# Patient Record
Sex: Male | Born: 1955
Health system: Southern US, Community
[De-identification: ages and names within clinical notes are randomized; demographics above are authoritative.]

## PROBLEM LIST (undated history)

## (undated) DIAGNOSIS — I77810 Thoracic aortic ectasia: Secondary | ICD-10-CM

## (undated) DIAGNOSIS — I4891 Unspecified atrial fibrillation: Secondary | ICD-10-CM

## (undated) DIAGNOSIS — I447 Left bundle-branch block, unspecified: Secondary | ICD-10-CM

## (undated) DIAGNOSIS — G4733 Obstructive sleep apnea (adult) (pediatric): Secondary | ICD-10-CM

## (undated) DIAGNOSIS — K589 Irritable bowel syndrome without diarrhea: Secondary | ICD-10-CM

## (undated) DIAGNOSIS — I2729 Other secondary pulmonary hypertension: Principal | ICD-10-CM

## (undated) DIAGNOSIS — I10 Essential (primary) hypertension: Secondary | ICD-10-CM

## (undated) HISTORY — PX: CHOLECYSTECTOMY: SHX55

## (undated) HISTORY — DX: Unspecified atrial fibrillation: I48.91

## (undated) HISTORY — DX: Essential (primary) hypertension: I10

## (undated) HISTORY — DX: Thoracic aortic ectasia: I77.810

## (undated) HISTORY — DX: Irritable bowel syndrome, unspecified: K58.9

## (undated) HISTORY — DX: Other secondary pulmonary hypertension: I27.29

## (undated) HISTORY — DX: Left bundle-branch block, unspecified: I44.7

## (undated) HISTORY — DX: Obstructive sleep apnea (adult) (pediatric): G47.33

---

## 1999-11-13 ENCOUNTER — Emergency Department (HOSPITAL_COMMUNITY): Admission: EM | Admit: 1999-11-13 | Discharge: 1999-11-13 | Payer: Self-pay | Admitting: Emergency Medicine

## 2002-11-25 ENCOUNTER — Ambulatory Visit (HOSPITAL_COMMUNITY): Admission: RE | Admit: 2002-11-25 | Discharge: 2002-11-25 | Payer: Self-pay | Admitting: Pulmonary Disease

## 2002-11-25 ENCOUNTER — Encounter: Payer: Self-pay | Admitting: Pulmonary Disease

## 2003-02-21 ENCOUNTER — Encounter: Payer: Self-pay | Admitting: General Surgery

## 2003-02-21 ENCOUNTER — Ambulatory Visit (HOSPITAL_COMMUNITY): Admission: RE | Admit: 2003-02-21 | Discharge: 2003-02-22 | Payer: Self-pay | Admitting: General Surgery

## 2014-04-28 ENCOUNTER — Encounter (HOSPITAL_BASED_OUTPATIENT_CLINIC_OR_DEPARTMENT_OTHER): Payer: Self-pay | Admitting: *Deleted

## 2014-04-28 ENCOUNTER — Emergency Department (HOSPITAL_COMMUNITY): Payer: BC Managed Care – PPO

## 2014-04-28 ENCOUNTER — Emergency Department (HOSPITAL_BASED_OUTPATIENT_CLINIC_OR_DEPARTMENT_OTHER): Payer: BC Managed Care – PPO

## 2014-04-28 ENCOUNTER — Emergency Department (HOSPITAL_BASED_OUTPATIENT_CLINIC_OR_DEPARTMENT_OTHER)
Admission: EM | Admit: 2014-04-28 | Discharge: 2014-04-29 | Disposition: A | Discharge status: Home / Self Care | Payer: BC Managed Care – PPO | Attending: Emergency Medicine | Admitting: Emergency Medicine

## 2014-04-28 DIAGNOSIS — R4182 Altered mental status, unspecified: Secondary | ICD-10-CM | POA: Diagnosis not present

## 2014-04-28 DIAGNOSIS — H532 Diplopia: Secondary | ICD-10-CM

## 2014-04-28 DIAGNOSIS — N289 Disorder of kidney and ureter, unspecified: Secondary | ICD-10-CM

## 2014-04-28 DIAGNOSIS — I10 Essential (primary) hypertension: Secondary | ICD-10-CM | POA: Insufficient documentation

## 2014-04-28 LAB — URINALYSIS, ROUTINE W REFLEX MICROSCOPIC
BILIRUBIN URINE: NEGATIVE
Glucose, UA: NEGATIVE mg/dL
Hgb urine dipstick: NEGATIVE
Ketones, ur: NEGATIVE mg/dL
Leukocytes, UA: NEGATIVE
Nitrite: NEGATIVE
PROTEIN: NEGATIVE mg/dL
Specific Gravity, Urine: 1.014 (ref 1.005–1.030)
UROBILINOGEN UA: 0.2 mg/dL (ref 0.0–1.0)
pH: 7 (ref 5.0–8.0)

## 2014-04-28 LAB — DIFFERENTIAL
BASOS PCT: 0 % (ref 0–1)
Basophils Absolute: 0 10*3/uL (ref 0.0–0.1)
Eosinophils Absolute: 0.3 10*3/uL (ref 0.0–0.7)
Eosinophils Relative: 3 % (ref 0–5)
LYMPHS ABS: 3.5 10*3/uL (ref 0.7–4.0)
Lymphocytes Relative: 34 % (ref 12–46)
MONO ABS: 0.9 10*3/uL (ref 0.1–1.0)
MONOS PCT: 9 % (ref 3–12)
NEUTROS ABS: 5.5 10*3/uL (ref 1.7–7.7)
Neutrophils Relative %: 54 % (ref 43–77)

## 2014-04-28 LAB — COMPREHENSIVE METABOLIC PANEL
ALT: 27 U/L (ref 0–53)
AST: 24 U/L (ref 0–37)
Albumin: 4.3 g/dL (ref 3.5–5.2)
Alkaline Phosphatase: 79 U/L (ref 39–117)
Anion gap: 13 (ref 5–15)
BUN: 16 mg/dL (ref 6–23)
CHLORIDE: 99 meq/L (ref 96–112)
CO2: 27 meq/L (ref 19–32)
CREATININE: 1.4 mg/dL — AB (ref 0.50–1.35)
Calcium: 9.6 mg/dL (ref 8.4–10.5)
GFR calc Af Amer: 64 mL/min — ABNORMAL LOW (ref >90)
GFR calc non Af Amer: 55 mL/min — ABNORMAL LOW (ref >90)
GLUCOSE: 105 mg/dL — AB (ref 70–99)
Potassium: 4.1 mEq/L (ref 3.7–5.3)
Sodium: 139 mEq/L (ref 137–147)
TOTAL PROTEIN: 8.3 g/dL (ref 6.0–8.3)
Total Bilirubin: 0.6 mg/dL (ref 0.3–1.2)

## 2014-04-28 LAB — RAPID URINE DRUG SCREEN, HOSP PERFORMED
AMPHETAMINES: NOT DETECTED
BARBITURATES: NOT DETECTED
BENZODIAZEPINES: NOT DETECTED
COCAINE: NOT DETECTED
Opiates: NOT DETECTED
TETRAHYDROCANNABINOL: NOT DETECTED

## 2014-04-28 LAB — ETHANOL: Alcohol, Ethyl (B): <11 mg/dL (ref 0–11)

## 2014-04-28 LAB — PROTIME-INR
INR: 0.9 (ref 0.00–1.49)
Prothrombin Time: 12.2 seconds (ref 11.6–15.2)

## 2014-04-28 LAB — CBC
HEMATOCRIT: 40.9 % (ref 39.0–52.0)
HEMOGLOBIN: 13.7 g/dL (ref 13.0–17.0)
MCH: 29.8 pg (ref 26.0–34.0)
MCHC: 33.5 g/dL (ref 30.0–36.0)
MCV: 89.1 fL (ref 78.0–100.0)
Platelets: 278 10*3/uL (ref 150–400)
RBC: 4.59 MIL/uL (ref 4.22–5.81)
RDW: 13.1 % (ref 11.5–15.5)
WBC: 10.4 10*3/uL (ref 4.0–10.5)

## 2014-04-28 LAB — APTT: APTT: 28 s (ref 24–37)

## 2014-04-28 LAB — TROPONIN I: Troponin I: <0.3 ng/mL (ref <0.30)

## 2014-04-28 MED ORDER — LORAZEPAM 2 MG/ML IJ SOLN
0.5000 mg | Freq: Once | INTRAMUSCULAR | Status: AC
  Administered 2014-04-28: 0.5 mg via INTRAVENOUS
  Filled 2014-04-28: qty 1

## 2014-04-28 MED ORDER — LABETALOL HCL 5 MG/ML IV SOLN
20.0000 mg | Freq: Once | INTRAVENOUS | Status: AC
  Administered 2014-04-28: 20 mg via INTRAVENOUS
  Filled 2014-04-28: qty 4

## 2014-04-28 MED ORDER — LORAZEPAM 2 MG/ML IJ SOLN
1.0000 mg | Freq: Once | INTRAMUSCULAR | Status: AC
  Administered 2014-04-28: 1 mg via INTRAVENOUS
  Filled 2014-04-28: qty 1

## 2014-04-28 NOTE — ED Provider Notes (Signed)
10:09 PM Patient sent [facility] for MRI to r/o stroke.  He has a complaint of diplopia.  Per Dr Sandi Carneelo's conversation with Dr Littie DeedsGentry, neurology was consulted and MRI recommended.  If MRI negative, pt may be d/c home.   10:26 PM Patient reports he was sitting at his computer around 11:00 this morning and suddenly noticed change in his vision.  Describes vertical diplopia with both eyes, normal vision with each eye.  States it does depend on where he looks, sometimes has the diplopia, other times he does not.  Denies any other focal neurologic deficits.   Patient is A&Ox4, NAD, RRR, lungs CTAB, abd soft, NT, CN II-XII intact, EOMs intact, no pronator drift, grip strengths equal bilaterally; strength 5/5 in all extremities, sensation intact in all extremities; finger to nose normal.    MRI available and ready for patient.  Ativan given for claustrophobia.    1:12 AM MRI is negative.  Awaiting call from neurology for discussion.      1:28 AM Discussed pt with Dr Thad Rangereynolds who originally spoke with Dr Littie DeedsGentry about the patient.  MRI is negative.  Plan is for ophthalmology follow up.    Discussed result, findings, treatment, and follow up  with patient.  Pt given return precautions.  Pt verbalizes understanding and agrees with plan.       Results for orders placed or performed during the hospital encounter of 04/28/14  Ethanol  Result Value Ref Range   Alcohol, Ethyl (B) <11 0 - 11 mg/dL  Protime-INR  Result Value Ref Range   Prothrombin Time 12.2 11.6 - 15.2 seconds   INR 0.90 0.00 - 1.49  APTT  Result Value Ref Range   aPTT 28 24 - 37 seconds  CBC  Result Value Ref Range   WBC 10.4 4.0 - 10.5 K/uL   RBC 4.59 4.22 - 5.81 MIL/uL   Hemoglobin 13.7 13.0 - 17.0 g/dL   HCT 16.140.9 09.639.0 - 04.552.0 %   MCV 89.1 78.0 - 100.0 fL   MCH 29.8 26.0 - 34.0 pg   MCHC 33.5 30.0 - 36.0 g/dL   RDW 40.913.1 81.111.5 - 91.415.5 %   Platelets 278 150 - 400 K/uL  Differential  Result Value Ref Range    Neutrophils Relative % 54 43 - 77 %   Neutro Abs 5.5 1.7 - 7.7 K/uL   Lymphocytes Relative 34 12 - 46 %   Lymphs Abs 3.5 0.7 - 4.0 K/uL   Monocytes Relative 9 3 - 12 %   Monocytes Absolute 0.9 0.1 - 1.0 K/uL   Eosinophils Relative 3 0 - 5 %   Eosinophils Absolute 0.3 0.0 - 0.7 K/uL   Basophils Relative 0 0 - 1 %   Basophils Absolute 0.0 0.0 - 0.1 K/uL  Comprehensive metabolic panel  Result Value Ref Range   Sodium 139 137 - 147 mEq/L   Potassium 4.1 3.7 - 5.3 mEq/L   Chloride 99 96 - 112 mEq/L   CO2 27 19 - 32 mEq/L   Glucose, Bld 105 (H) 70 - 99 mg/dL   BUN 16 6 - 23 mg/dL   Creatinine, Ser 7.821.40 (H) 0.50 - 1.35 mg/dL   Calcium 9.6 8.4 - 95.610.5 mg/dL   Total Protein 8.3 6.0 - 8.3 g/dL   Albumin 4.3 3.5 - 5.2 g/dL   AST 24 0 - 37 U/L   ALT 27 0 - 53 U/L   Alkaline Phosphatase 79 39 - 117 U/L  Total Bilirubin 0.6 0.3 - 1.2 mg/dL   GFR calc non Af Amer 55 (L) >90 mL/min   GFR calc Af Amer 64 (L) >90 mL/min   Anion gap 13 5 - 15  Urine Drug Screen  Result Value Ref Range   Opiates NONE DETECTED NONE DETECTED   Cocaine NONE DETECTED NONE DETECTED   Benzodiazepines NONE DETECTED NONE DETECTED   Amphetamines NONE DETECTED NONE DETECTED   Tetrahydrocannabinol NONE DETECTED NONE DETECTED   Barbiturates NONE DETECTED NONE DETECTED  Urinalysis, Routine w reflex microscopic  Result Value Ref Range   Color, Urine YELLOW YELLOW   APPearance CLEAR CLEAR   Specific Gravity, Urine 1.014 1.005 - 1.030   pH 7.0 5.0 - 8.0   Glucose, UA NEGATIVE NEGATIVE mg/dL   Hgb urine dipstick NEGATIVE NEGATIVE   Bilirubin Urine NEGATIVE NEGATIVE   Ketones, ur NEGATIVE NEGATIVE mg/dL   Protein, ur NEGATIVE NEGATIVE mg/dL   Urobilinogen, UA 0.2 0.0 - 1.0 mg/dL   Nitrite NEGATIVE NEGATIVE   Leukocytes, UA NEGATIVE NEGATIVE  Troponin I  Result Value Ref Range   Troponin I <0.30 <0.30 ng/mL   Ct Head Wo Contrast  04/28/2014   CLINICAL DATA:  Diplopia.  EXAM: CT HEAD WITHOUT CONTRAST  TECHNIQUE:  Contiguous axial images were obtained from the base of the skull through the vertex without intravenous contrast.  COMPARISON:  None.  FINDINGS: The brain demonstrates no evidence of hemorrhage, infarction, edema, mass effect, extra-axial fluid collection, hydrocephalus or mass lesion. The skull is unremarkable.  IMPRESSION: Normal head CT.   Electronically Signed   By: Irish Lack M.D.   On: 04/28/2014 20:00   Mr Brain Wo Contrast  04/29/2014   CLINICAL DATA:  Initial evaluation for sudden onset diplopia.  EXAM: MRI HEAD WITHOUT CONTRAST  TECHNIQUE: Multiplanar, multiecho pulse sequences of the brain and surrounding structures were obtained without intravenous contrast.  COMPARISON:  Prior head CT performed earlier on the same day.  FINDINGS: Study is somewhat degraded by motion artifact.  Age-appropriate atrophy is present. Few scattered tiny T2/FLAIR hyperintensities noted within the periventricular and deep white matter, nonspecific. No mass lesion, midline shift, or extra-axial fluid collection. Ventricles are normal in size without evidence of hydrocephalus.  No diffusion-weighted signal abnormality is identified to suggest acute intracranial infarct. Gray-white matter differentiation is maintained. Normal flow voids are seen within the intracranial vasculature. No intracranial hemorrhage identified.  The cervicomedullary junction is normal. Pituitary gland is within normal limits. Pituitary stalk is midline. The globes and optic nerves demonstrate a normal appearance with normal signal intensity. The  The bone marrow signal intensity is normal. Calvarium is intact. Visualized upper cervical spine is within normal limits.  Scalp soft tissues are unremarkable.  Mild polypoid mucosal thickening present within the right maxillary sinus. Paranasal sinuses are otherwise clear. No mastoid effusion.  IMPRESSION: Normal MRI for patient age with no acute intracranial abnormality identified.   Electronically  Signed   By: Rise Mu M.D.   On: 04/29/2014 00:01   Dg Chest Portable 1 View  04/28/2014   CLINICAL DATA:  Diplopia.  EXAM: PORTABLE CHEST - 1 VIEW  COMPARISON:  None.  FINDINGS: The heart size is at the upper limits of normal. Pulmonary venous hypertensive changes present without evidence of overt edema. There is no evidence of airspace consolidation, pneumothorax, nodule or pleural fluid. The visualized skeletal structures are unremarkable.  IMPRESSION: Pulmonary venous hypertensive changes and top-normal heart size.   Electronically Signed   By:  Irish LackGlenn  Yamagata M.D.   On: 04/28/2014 19:56      Trixie Dredgemily Katha Kuehne, PA-C 04/29/14 0141  Geoffery Lyonsouglas Delo, MD 04/29/14 [phone]

## 2014-04-28 NOTE — ED Notes (Signed)
Patient transported to CT 

## 2014-04-28 NOTE — ED Notes (Signed)
Pt reports he was working on computer at work today and sudden onset double vision- states sx have not resolved- pt wears reading glasses- ambulatory with steady gait

## 2014-04-28 NOTE — ED Notes (Signed)
Double vision since 11am this morning. Pt has no medical history and doesn't take any meds. Pt denies headache, weakness or chest pain/SOB. Pt anxious about MRI. VSS BP 190/110

## 2014-04-28 NOTE — ED Provider Notes (Signed)
CSN: [id number]     Arrival date & time 04/28/14  1842 History   First MD Initiated Contact with Patient 04/28/14 1856     Chief Complaint  Patient presents with  . Diplopia     (Consider location/radiation/quality/duration/timing/severity/associated sxs/prior Treatment) Patient is a 58 y.o. male presenting with eye problem.  Eye Problem Location:  Both Quality: double vision. Severity:  Moderate Onset quality:  Sudden Duration:  7 hours Timing:  Constant Progression:  Unchanged Chronicity:  New Context comment:  While sitting at the computer Relieved by:  Closing eye Worsened by:  Nothing tried Ineffective treatments:  None tried Associated symptoms: double vision     History reviewed. No pertinent past medical history. Past Surgical History  Procedure Laterality Date  . Cholecystectomy     History reviewed. No pertinent family history. History  Substance Use Topics  . Smoking status: Current Every Day Smoker -- 0.50 packs/day    Types: Cigarettes  . Smokeless tobacco: Not on file  . Alcohol Use: No    Review of Systems  Eyes: Positive for double vision.  All other systems reviewed and are negative.     Allergies  Review of patient's allergies indicates no known allergies.  Home Medications   Prior to Admission medications   Medication Sig Start Date End Date Taking? Authorizing Provider  aspirin EC 81 MG tablet Take 81 mg by mouth daily.   Yes Historical Provider, MD  loperamide (IMODIUM) 2 MG capsule Take 2 mg by mouth as needed for diarrhea or loose stools.   Yes Historical Provider, MD   BP 178/97 mmHg  Pulse 76  Temp(Src) 98.5 F (36.9 C) (Oral)  Resp 18  Ht 6\' 4"  (1.93 m)  Wt 285 lb (129.275 kg)  BMI 34.71 kg/m2  SpO2 98% Physical Exam  Constitutional: He is oriented to person, place, and time. He appears well-developed and well-nourished.  HENT:  Head: Normocephalic and atraumatic.  Eyes: Conjunctivae and EOM are normal.  Neck: Normal  range of motion. Neck supple.  Cardiovascular: Normal rate, regular rhythm and normal heart sounds.   Pulmonary/Chest: Effort normal and breath sounds normal. No respiratory distress.  Abdominal: He exhibits no distension. There is no tenderness. There is no rebound and no guarding.  Musculoskeletal: Normal range of motion.  Neurological: He is alert and oriented to person, place, and time. He has normal strength and normal reflexes. No cranial nerve deficit or sensory deficit.  Skin: Skin is warm and dry.  Vitals reviewed.   ED Course  Procedures (including critical care time) Labs Review Labs Reviewed  COMPREHENSIVE METABOLIC PANEL - Abnormal; Notable for the following:    Glucose, Bld 105 (*)    Creatinine, Ser 1.40 (*)    GFR calc non Af Amer 55 (*)    GFR calc Af Amer 64 (*)    All other components within normal limits  ETHANOL  PROTIME-INR  APTT  CBC  DIFFERENTIAL  URINE RAPID DRUG SCREEN (HOSP PERFORMED)  URINALYSIS, ROUTINE W REFLEX MICROSCOPIC  TROPONIN I    Imaging Review Ct Head Wo Contrast  04/28/2014   CLINICAL DATA:  Diplopia.  EXAM: CT HEAD WITHOUT CONTRAST  TECHNIQUE: Contiguous axial images were obtained from the base of the skull through the vertex without intravenous contrast.  COMPARISON:  None.  FINDINGS: The brain demonstrates no evidence of hemorrhage, infarction, edema, mass effect, extra-axial fluid collection, hydrocephalus or mass lesion. The skull is unremarkable.  IMPRESSION: Normal head CT.   Electronically Signed  By: Irish LackGlenn  Yamagata M.D.   On: 04/28/2014 20:00   Dg Chest Portable 1 View  04/28/2014   CLINICAL DATA:  Diplopia.  EXAM: PORTABLE CHEST - 1 VIEW  COMPARISON:  None.  FINDINGS: The heart size is at the upper limits of normal. Pulmonary venous hypertensive changes present without evidence of overt edema. There is no evidence of airspace consolidation, pneumothorax, nodule or pleural fluid. The visualized skeletal structures are  unremarkable.  IMPRESSION: Pulmonary venous hypertensive changes and top-normal heart size.   Electronically Signed   By: Irish LackGlenn  Yamagata M.D.   On: 04/28/2014 19:56     EKG Interpretation   Date/Time:  Monday April 28 2014 19:03:16 EST Ventricular Rate:  90 PR Interval:  152 QRS Duration: 84 QT Interval:  392 QTC Calculation: 479 R Axis:   54 Text Interpretation:  Normal sinus rhythm Normal ECG No significant change  since last tracing Confirmed by Mirian MoGentry, Kalonji Zurawski [phone]([id number]) on 04/28/2014  8:09:02 PM      MDM   Final diagnoses:  Altered mental status  Diplopia    58 y.o. male without pertinent PMH presents with onset of double vision without other symptoms.  No other symptoms. No focal neurodeficits. Patient hypertensive to 230/130. Rest of exam unremarkable. Patient denies headache, GI symptoms, rest or symptoms. He denies any pain.  Labetalol 20mg  given with resolution of hypertension but symptoms persisted. I spoke with neurology who suggested MRI. [facility]. Patient transferred in stable condition.  1. Diplopia   2. Altered mental status         Mirian MoMatthew Axcel Horsch, MD 04/28/14 [phone]

## 2014-04-29 NOTE — Discharge Instructions (Signed)
Read the information below.  You may return to the Emergency Department at any time for worsening condition or any new symptoms that concern you.   If you develop weakness or numbness in your arms or legs, difficulty speaking or walking, pain in your eye, change in your vision, swelling around your eye, difficulty moving your eye, or fevers greater than 100.4, return to the Emergency Department immediately for a recheck.   Please have your blood pressure and your kidney function rechecked within one week.   Diplopia  Double vision (diplopia) means that you are seeing two of everything. Diplopia usually occurs with both eyes open, and may be worse when looking in one particular direction. If both eyes must be open to see double, this is called binocular diplopia. If double images are seen in just one eye, this is called monocular diplopia.  CAUSES  Binocular Diplopia  Disorder affecting the muscles that move the eyes or the nerves that control those muscles.  Tumor or other mass pushing on an eye from beside or behind the eye.  Myasthenia gravis. This is a neuromuscular illness that causes the body's muscles to tire easily. The eye muscles and eyelid muscles become weak. The eyes do not track together well.  Grave's disease. This is an overactivity of the thyroid gland. This condition causes swelling of tissues around the eyes. This produces a bulging out of the eyeball.  Blowout fracture of the bone around the eye. The muscles of the eye socket are damaged. This often happens when the eye is hit with force.  Complications after certain surgeries, such as a lens implant after cataract surgery.  Fluid-filled mass (abscess) behind or beside the eye, infection, or abnormal connection between arteries and veins. Sometimes, no cause is found. Monocular Diplopia  Problems with corrective lens or contacts.  Corneal abrasion, infection, severe injury, or bulging and irregularity of the corneal  surface (keratoconus).  Irregularities of the pupil from drugs, severe injury, or other causes.  Problems involving the lens of the eye, such as opacities or cataracts.  Complications after certain surgeries, such as a lens implant after cataract surgery.  Retinal detachment or problems involving the blood vessels of the retina. Sometimes, no cause is found. SYMPTOMS  Binocular Diplopia  When one eye is closed or covered, the double images disappear. Monocular Diplopia  When the unaffected eye is closed or covered, the double images remain. The double images disappear when the affected eye is closed or covered. DIAGNOSIS  A diagnosis is made during an eye exam. TREATMENT  Treatment depends on the cause or underlying disease.   Relief of double vision symptoms may be achieved by patching one eye or by using special glasses.  Surgery on the muscles of the eye may be needed. SEEK IMMEDIATE MEDICAL CARE IF:   You see two images of a single object you are looking at, either with both eyes open or with just one eye open. Document Released: 03/17/2004 Document Revised: 08/08/2011 Document Reviewed: 01/02/2008 [facility] Patient Information 2015 [employer] [place]. This information is not intended to replace advice given to you by your health care provider. Make sure you discuss any questions you have with your health care provider.

## 2014-05-16 ENCOUNTER — Encounter: Payer: Self-pay | Admitting: *Deleted

## 2014-05-20 ENCOUNTER — Encounter: Payer: Self-pay | Admitting: Cardiology

## 2014-05-20 ENCOUNTER — Ambulatory Visit (INDEPENDENT_AMBULATORY_CARE_PROVIDER_SITE_OTHER): Payer: BC Managed Care – PPO | Admitting: Cardiology

## 2014-05-20 VITALS — BP 130/88 | HR 85 | Ht 76.0 in | Wt 269.8 lb

## 2014-05-20 DIAGNOSIS — I2729 Other secondary pulmonary hypertension: Secondary | ICD-10-CM

## 2014-05-20 DIAGNOSIS — I10 Essential (primary) hypertension: Secondary | ICD-10-CM

## 2014-05-20 DIAGNOSIS — I270 Primary pulmonary hypertension: Secondary | ICD-10-CM

## 2014-05-20 HISTORY — DX: Other secondary pulmonary hypertension: I27.29

## 2014-05-20 NOTE — Progress Notes (Signed)
  [address] [place], [place]  [address] Phone: [phone] Fax:  [phone]  Date:  05/20/2014   ID:  Jesse Rice, DOB [birth year], MRN [id number]  PCP:  Marcelo BaldyMAUNEY, JESSICA S, PA-C  Cardiologist:  Armanda Magicraci Mahima Hottle MD    History of Present Illness: Jesse Rice is a 58 y.o. male with no prior cardiac history who is referred by his PCP for evaluation of possible pulmonary HTN.  [facility] acute diplopia in the setting of hypertensive crisis and workup was negative.  A chest xray was done which noted pulmonary venous HTN but no CHF and he is now referred here for further evaluation.  He denies any chest pain or pressure, SOB, LE edema, palpitations, dizziness or syncope.  He rarely will have some DOE when running up a lot of stairs.  He states that he has been a "salt freak" in the past and is now following a low sodium diet.     Wt Readings from Last 3 Encounters:  05/20/14 269 lb 12.8 oz (122.38 kg)  04/28/14 285 lb (129.275 kg)     Past Medical History  Diagnosis Date  . IBS (irritable bowel syndrome)   . Hypertensive pulmonary venous disease 05/20/2014  . Hypertension     Current Outpatient Prescriptions  Medication Sig Dispense Refill  . aspirin EC 81 MG tablet Take 81 mg by mouth daily.    Marland Kitchen. lisinopril-hydrochlorothiazide (PRINZIDE,ZESTORETIC) 20-25 MG per tablet Take 1 tablet by mouth daily.     Marland Kitchen. loperamide (IMODIUM) 2 MG capsule Take 2 mg by mouth as needed for diarrhea or loose stools.     No current facility-administered medications for this visit.    Allergies:   No Known Allergies  Social History:  The patient  reports that he has quit smoking. His smoking use included Cigarettes. He smoked 0.50 packs per day. He does not have any smokeless tobacco history on file. He reports that he does not drink alcohol or use illicit drugs.   Family History:  The patient's family history includes Arrhythmia in his mother; COPD in his  father; Diabetes in his father; Heart murmur in his father; Hypertension in his father and mother; Rheumatic fever in his father.   ROS:  Please see the history of present illness.      All other systems reviewed and negative.   PHYSICAL EXAM: VS:  BP 130/88 mmHg  Pulse 85  Ht 6\' 4"  (1.93 m)  Wt 269 lb 12.8 oz (122.38 kg)  BMI 32.85 kg/m2 Well nourished, well developed, in no acute distress HEENT: normal Neck: no JVD Cardiac:  normal S1, S2; RRR; no murmur Lungs:  clear to auscultation bilaterally, no wheezing, rhonchi or rales Abd: soft, nontender, no hepatomegaly Ext: no edema Skin: warm and dry Neuro:  CNs 2-12 intact, no focal abnormalities noted  EKG:     NSR with no ST changes  ASSESSMENT AND PLAN:  1. HTN with good control.  He is following a low sodium diet.  Continue Lisinopril HCT 2. Pulmonary venous HTN by chest xray most likely related to hypertensive urgency.  I will get a 2D echo to assess for pulmonary HTN and also LVF.     Signed, Armanda Magicraci Colbin Jovel, MD [facility] 05/20/2014 11:20 AM

## 2014-05-20 NOTE — Patient Instructions (Addendum)
Your physician has requested that you have an echocardiogram. Echocardiography is a painless test that uses sound waves to create images of your heart. It provides your doctor with information about the size and shape of your heart and how well your heart's chambers and valves are working. This procedure takes approximately one hour. There are no restrictions for this procedure.  Contact Dr. Mayford Knifeurner for follow up as needed.

## 2014-05-27 ENCOUNTER — Ambulatory Visit (HOSPITAL_COMMUNITY): Payer: BC Managed Care – PPO | Attending: Cardiology | Admitting: Cardiology

## 2014-05-27 DIAGNOSIS — I10 Essential (primary) hypertension: Secondary | ICD-10-CM | POA: Diagnosis not present

## 2014-05-27 DIAGNOSIS — E669 Obesity, unspecified: Secondary | ICD-10-CM | POA: Insufficient documentation

## 2014-05-27 DIAGNOSIS — Z87891 Personal history of nicotine dependence: Secondary | ICD-10-CM | POA: Diagnosis not present

## 2014-05-27 DIAGNOSIS — I2729 Other secondary pulmonary hypertension: Secondary | ICD-10-CM

## 2014-05-27 DIAGNOSIS — I270 Primary pulmonary hypertension: Secondary | ICD-10-CM | POA: Diagnosis present

## 2014-05-27 NOTE — Progress Notes (Signed)
Echo performed. 

## 2016-12-08 ENCOUNTER — Ambulatory Visit (INDEPENDENT_AMBULATORY_CARE_PROVIDER_SITE_OTHER): Payer: BLUE CROSS/BLUE SHIELD | Admitting: Orthopedic Surgery

## 2016-12-08 ENCOUNTER — Encounter (INDEPENDENT_AMBULATORY_CARE_PROVIDER_SITE_OTHER): Payer: Self-pay | Admitting: Orthopedic Surgery

## 2016-12-08 VITALS — Ht 76.0 in | Wt 269.0 lb

## 2016-12-08 DIAGNOSIS — M25561 Pain in right knee: Secondary | ICD-10-CM | POA: Diagnosis not present

## 2016-12-08 MED ORDER — METHYLPREDNISOLONE ACETATE 40 MG/ML IJ SUSP
40.0000 mg | INTRAMUSCULAR | Status: AC | PRN
  Administered 2016-12-08: 40 mg via INTRA_ARTICULAR

## 2016-12-08 MED ORDER — LIDOCAINE HCL 1 % IJ SOLN
5.0000 mL | INTRAMUSCULAR | Status: AC | PRN
  Administered 2016-12-08: 5 mL

## 2016-12-08 NOTE — Progress Notes (Signed)
Office Visit Note   Patient: Jesse Rice           Date of Birth: 04/25/1956           MRN: [id number] Visit Date: 12/08/2016              Requested by: Marcelo Baldy, PA-C [address] [place], [place] [address] PCP: Marcelo Baldy, PA-C  Chief Complaint  Patient presents with  . Right Knee - Pain    Work related injury climbing into trailer.       HPI: Patient states that on 11/21/2016 he was getting up on a trailer when he twisted his right knee with external rotation causing acute pain. Patient complains of pain both medially and laterally posteriorly as well as in the patellofemoral joint. He states the knee feels like it'll occasionally lock up on him and states that he does have decreased range of motion due to swelling. He has been using ice and elevation. He has been using ibuprofen. Radiographs were obtained which showed no evidence of a fracture.  Assessment & Plan: Visit Diagnoses:  1. Acute pain of right knee     Plan: Patient was given a note to postpone jury duty for 2 months. He was given a note for light duty seated work for the next 2 weeks. Recommended continued ice and elevation and weightbearing as tolerated with his crutches. Right knee was injected without complications from the anteromedial portal.  Follow-Up Instructions: Return in about 2 weeks (around 12/22/2016).   Ortho Exam  Patient is alert, oriented, no adenopathy, well-dressed, normal affect, normal respiratory effort. Patient has an antalgic gait. He has a mild effusion collaterals and cruciates are stable the MCL is nontender to palpation medial and lateral joint lines are minimally tender to palpation there is no tenderness in the patellofemoral joint with palpation.  Imaging: No results found.  Labs: No results found for: HGBA1C, ESRSEDRATE, CRP, LABURIC, REPTSTATUS, GRAMSTAIN, CULT, LABORGA  Orders:  No orders of the defined types were placed in this encounter.  No  orders of the defined types were placed in this encounter.    Procedures: Large Joint Inj Date/Time: 12/08/2016 9:17 AM Performed by: DUDA, MARCUS V Authorized by: Nadara Mustard   Consent Given by:  Patient Site marked: the procedure site was marked   Timeout: prior to procedure the correct patient, procedure, and site was verified   Indications:  Pain and diagnostic evaluation Location:  Knee Site:  R knee Prep: patient was prepped and draped in usual sterile fashion   Needle Size:  22 G Needle Length:  1.5 inches Approach:  Anteromedial Ultrasound Guidance: No   Fluoroscopic Guidance: No   Arthrogram: No   Medications:  5 mL lidocaine 1 %; 40 mg methylPREDNISolone acetate 40 MG/ML Aspiration Attempted: No   Patient tolerance:  Patient tolerated the procedure well with no immediate complications    Clinical Data: No additional findings.  ROS:  All other systems negative, except as noted in the HPI. Review of Systems  Objective: Vital Signs: Ht 6\' 4"  (1.93 m)   Wt 269 lb (122 kg)   BMI 32.74 kg/m   Specialty Comments:  No specialty comments available.  PMFS History: Patient Active Problem List   Diagnosis Date Noted  . Hypertensive pulmonary venous disease (HCC) 05/20/2014  . Benign essential HTN 05/20/2014   Past Medical History:  Diagnosis Date  . Hypertension   . Hypertensive pulmonary venous disease (HCC) 05/20/2014  .  IBS (irritable bowel syndrome)     Family History  Problem Relation Age of Onset  . Hypertension Mother   . Arrhythmia Mother   . Rheumatic fever Father   . Heart murmur Father   . Hypertension Father   . COPD Father   . Diabetes Father     Past Surgical History:  Procedure Laterality Date  . CHOLECYSTECTOMY     Social History   Occupational History  . Not on file.   Social History Main Topics  . Smoking status: Former Smoker    Packs/day: 0.50    Types: Cigarettes  . Smokeless tobacco: Never Used  . Alcohol use No    . Drug use: No  . Sexual activity: Not on file

## 2016-12-22 ENCOUNTER — Encounter (INDEPENDENT_AMBULATORY_CARE_PROVIDER_SITE_OTHER): Payer: Self-pay | Admitting: Orthopedic Surgery

## 2016-12-22 ENCOUNTER — Ambulatory Visit (INDEPENDENT_AMBULATORY_CARE_PROVIDER_SITE_OTHER): Payer: BLUE CROSS/BLUE SHIELD | Admitting: Orthopedic Surgery

## 2016-12-22 DIAGNOSIS — M25561 Pain in right knee: Secondary | ICD-10-CM

## 2016-12-22 NOTE — Progress Notes (Signed)
   Office Visit Note   Patient: Jesse Rice           Date of Birth: [birth year]           MRN: [id number] Visit Date: 12/22/2016              Requested by: Marcelo BaldyMauney, Jessica S, PA-C [address] [place], [place] [address] PCP: Marcelo BaldyMauney, Jessica S, PA-C  Chief Complaint  Patient presents with  . Right Knee - Follow-up      HPI: Patient states he still has knee pain with hyperflexion and with kneeling as well as from getting up from a low position. He states that the injection provided him good relief he is been on light duty work. Patient complains of pain primarily in the patellofemoral joint.  Assessment & Plan: Visit Diagnoses:  1. Acute pain of right knee     Plan: We will allow patient return to work without restrictions discussed that we can proceed with approximately 3 injections a year if necessary. Patient was given instructions for quad isometrics straight leg raises to improve the VMO strength.  Follow-Up Instructions: Return if symptoms worsen or fail to improve.   Ortho Exam  Patient is alert, oriented, no adenopathy, well-dressed, normal affect, normal respiratory effort. Examination patient has no effusion is full range of motion of his knee he has minimal tenderness to palpation in the patellofemoral joint there is no tenderness to palpation medial lateral joint line collaterals and cruciates are stable. There is no redness no cellulitis.  Imaging: No results found.  Labs: No results found for: HGBA1C, ESRSEDRATE, CRP, LABURIC, REPTSTATUS, GRAMSTAIN, CULT, LABORGA  Orders:  No orders of the defined types were placed in this encounter.  No orders of the defined types were placed in this encounter.    Procedures: No procedures performed  Clinical Data: No additional findings.  ROS:  All other systems negative, except as noted in the HPI. Review of Systems  Objective: Vital Signs: There were no vitals taken for this visit.  Specialty  Comments:  No specialty comments available.  PMFS History: Patient Active Problem List   Diagnosis Date Noted  . Hypertensive pulmonary venous disease (HCC) 05/20/2014  . Benign essential HTN 05/20/2014   Past Medical History:  Diagnosis Date  . Hypertension   . Hypertensive pulmonary venous disease (HCC) 05/20/2014  . IBS (irritable bowel syndrome)     Family History  Problem Relation Age of Onset  . Hypertension Mother   . Arrhythmia Mother   . Rheumatic fever Father   . Heart murmur Father   . Hypertension Father   . COPD Father   . Diabetes Father     Past Surgical History:  Procedure Laterality Date  . CHOLECYSTECTOMY     Social History   Occupational History  . Not on file.   Social History Main Topics  . Smoking status: Former Smoker    Packs/day: 0.50    Types: Cigarettes  . Smokeless tobacco: Never Used  . Alcohol use No  . Drug use: No  . Sexual activity: Not on file

## 2017-03-17 DIAGNOSIS — I10 Essential (primary) hypertension: Secondary | ICD-10-CM | POA: Diagnosis not present

## 2017-03-17 DIAGNOSIS — N183 Chronic kidney disease, stage 3 (moderate): Secondary | ICD-10-CM | POA: Diagnosis not present

## 2017-06-05 ENCOUNTER — Ambulatory Visit (INDEPENDENT_AMBULATORY_CARE_PROVIDER_SITE_OTHER): Payer: BLUE CROSS/BLUE SHIELD | Admitting: Orthopedic Surgery

## 2017-06-05 ENCOUNTER — Encounter (INDEPENDENT_AMBULATORY_CARE_PROVIDER_SITE_OTHER): Payer: Self-pay | Admitting: Orthopedic Surgery

## 2017-06-05 VITALS — Ht 76.0 in | Wt 269.0 lb

## 2017-06-05 DIAGNOSIS — M1711 Unilateral primary osteoarthritis, right knee: Secondary | ICD-10-CM

## 2017-06-05 MED ORDER — METHYLPREDNISOLONE ACETATE 40 MG/ML IJ SUSP
40.0000 mg | INTRAMUSCULAR | Status: AC | PRN
Start: 2017-06-05 — End: 2017-06-05
  Administered 2017-06-05: 40 mg via INTRA_ARTICULAR

## 2017-06-05 MED ORDER — LIDOCAINE HCL 1 % IJ SOLN
5.0000 mL | INTRAMUSCULAR | Status: AC | PRN
  Administered 2017-06-05: 5 mL

## 2017-06-05 NOTE — Progress Notes (Signed)
   Office Visit Note   Patient: Jesse BeetsBenny A Mangini           Date of Birth: 03/21/1956           MRN: [id number] Visit Date: 06/05/2017              Requested by: Marcelo BaldyMauney, Jessica S, PA-C [address] [place], [place] [address] PCP: Marcelo BaldyMauney, Jessica S, PA-C  Chief Complaint  Patient presents with  . Right Knee - Follow-up      HPI: Patient presents today in follow up for right knee pain with flexion and with kneeling as well as from getting up from a low position. He states that the injection provided him good relief, lasted about 3 months. Patient complains of pain primarily in the patellofemoral joint.  Requesting repeat injection today.   Assessment & Plan: Visit Diagnoses:  No diagnosis found.  Plan: DepoMedrol injection today. Patient was given instructions for quad isometrics straight leg raises to improve the VMO strength.  Follow-Up Instructions: No Follow-up on file.   Ortho Exam  Patient is alert, oriented, no adenopathy, well-dressed, normal affect, normal respiratory effort. Examination patient has no effusion is full range of motion of his knee he has minimal tenderness to palpation in the patellofemoral joint there is no tenderness to palpation medial lateral joint line collaterals and cruciates are stable. There is no redness no cellulitis.  Imaging: No results found.  Labs: No results found for: HGBA1C, ESRSEDRATE, CRP, LABURIC, REPTSTATUS, GRAMSTAIN, CULT, LABORGA  Orders:  No orders of the defined types were placed in this encounter.  No orders of the defined types were placed in this encounter.    Procedures: Large Joint Inj: R knee on 06/05/2017 4:56 PM Indications: pain Details: 18 G 1.5 in needle, anteromedial approach Medications: 5 mL lidocaine 1 %; 40 mg methylPREDNISolone acetate 40 MG/ML Consent was given by the patient.      Clinical Data: No additional findings.  ROS:  All other systems negative, except as noted in the  HPI. Review of Systems  Constitutional: Negative for chills and fever.  Musculoskeletal: Positive for arthralgias and gait problem. Negative for joint swelling.    Objective: Vital Signs: Ht 6\' 4"  (1.93 m)   Wt 269 lb (122 kg)   BMI 32.74 kg/m   Specialty Comments:  No specialty comments available.  PMFS History: Patient Active Problem List   Diagnosis Date Noted  . Hypertensive pulmonary venous disease (HCC) 05/20/2014  . Benign essential HTN 05/20/2014   Past Medical History:  Diagnosis Date  . Hypertension   . Hypertensive pulmonary venous disease (HCC) 05/20/2014  . IBS (irritable bowel syndrome)     Family History  Problem Relation Age of Onset  . Hypertension Mother   . Arrhythmia Mother   . Rheumatic fever Father   . Heart murmur Father   . Hypertension Father   . COPD Father   . Diabetes Father     Past Surgical History:  Procedure Laterality Date  . CHOLECYSTECTOMY     Social History   Occupational History  . Not on file  Tobacco Use  . Smoking status: Former Smoker    Packs/day: 0.50    Types: Cigarettes  . Smokeless tobacco: Never Used  Substance and Sexual Activity  . Alcohol use: No  . Drug use: No  . Sexual activity: Not on file

## 2017-12-06 ENCOUNTER — Encounter (INDEPENDENT_AMBULATORY_CARE_PROVIDER_SITE_OTHER): Payer: Self-pay | Admitting: Family

## 2017-12-06 ENCOUNTER — Ambulatory Visit (INDEPENDENT_AMBULATORY_CARE_PROVIDER_SITE_OTHER): Payer: BLUE CROSS/BLUE SHIELD | Admitting: Family

## 2017-12-06 DIAGNOSIS — G8929 Other chronic pain: Secondary | ICD-10-CM

## 2017-12-06 DIAGNOSIS — M25561 Pain in right knee: Secondary | ICD-10-CM | POA: Diagnosis not present

## 2017-12-06 MED ORDER — LIDOCAINE HCL 1 % IJ SOLN
5.0000 mL | INTRAMUSCULAR | Status: AC | PRN
  Administered 2017-12-06: 5 mL

## 2017-12-06 MED ORDER — METHYLPREDNISOLONE ACETATE 40 MG/ML IJ SUSP
40.0000 mg | INTRAMUSCULAR | Status: AC | PRN
  Administered 2017-12-06: 40 mg via INTRA_ARTICULAR

## 2017-12-06 NOTE — Progress Notes (Signed)
Office Visit Note   Patient: Jesse Rice           Date of Birth: 1955/07/19           MRN: [id number] Visit Date: 12/06/2017              Requested by: Marcelo BaldyMauney, Jessica S, PA-C [address] [place], [place] [address] PCP: Marcelo BaldyMauney, Jessica S, PA-C  No chief complaint on file.     HPI: Patient is a 62 year old gentleman seen today in follow up for continued pain and difficulty with activities of daily living following an injury on 11/21/2016. he was getting up on a trailer when he twisted his right knee with external rotation causing acute pain. Patient complains of pain both medially and laterally posteriorly as well as in the patellofemoral joint. He states the knee feels like it'll occasionally lock up on him and states that he does have decreased range of motion due to swelling. He has been using ibuprofen. Especially having difficulty with squatting and climbing ladders. Has had interval relief with depomedrol injections. Last injection 6 months ago.   Assessment & Plan: Visit Diagnoses:  1. Chronic pain of right knee     Plan: Suspect meniscal injury. Will repeat depomedrol injection. Offered mri. Discussed possible surgical options.  Follow-Up Instructions: Return if symptoms worsen or fail to improve.   Ortho Exam  Patient is alert, oriented, no adenopathy, well-dressed, normal affect, normal respiratory effort. Patient has an antalgic gait. He has a mild effusion collaterals and cruciates are stable the MCL is nontender to palpation medial and lateral joint lines are minimally tender to palpation there is no tenderness in the patellofemoral joint with palpation.  Imaging: No results found.  Labs: No results found for: HGBA1C, ESRSEDRATE, CRP, LABURIC, REPTSTATUS, GRAMSTAIN, CULT, LABORGA  Orders:  No orders of the defined types were placed in this encounter.  No orders of the defined types were placed in this encounter.    Procedures: Large Joint Inj: R  knee on 12/06/2017 3:31 PM Indications: pain Details: 18 G 1.5 in needle, anteromedial approach Medications: 5 mL lidocaine 1 %; 40 mg methylPREDNISolone acetate 40 MG/ML Consent was given by the patient.      Clinical Data: No additional findings.  ROS:  All other systems negative, except as noted in the HPI. Review of Systems  Constitutional: Negative for chills and fever.  Musculoskeletal: Positive for arthralgias, gait problem and joint swelling.    Objective: Vital Signs: There were no vitals taken for this visit.  Specialty Comments:  No specialty comments available.  PMFS History: Patient Active Problem List   Diagnosis Date Noted  . Hypertensive pulmonary venous disease (HCC) 05/20/2014  . Benign essential HTN 05/20/2014   Past Medical History:  Diagnosis Date  . Hypertension   . Hypertensive pulmonary venous disease (HCC) 05/20/2014  . IBS (irritable bowel syndrome)     Family History  Problem Relation Age of Onset  . Hypertension Mother   . Arrhythmia Mother   . Rheumatic fever Father   . Heart murmur Father   . Hypertension Father   . COPD Father   . Diabetes Father     Past Surgical History:  Procedure Laterality Date  . CHOLECYSTECTOMY     Social History   Occupational History  . Not on file  Tobacco Use  . Smoking status: Former Smoker    Packs/day: 0.50    Types: Cigarettes  . Smokeless tobacco: Never Used  Substance and Sexual Activity  . Alcohol use: No  . Drug use: No  . Sexual activity: Not on file

## 2018-02-08 DIAGNOSIS — N183 Chronic kidney disease, stage 3 (moderate): Secondary | ICD-10-CM | POA: Diagnosis not present

## 2018-02-08 DIAGNOSIS — Z125 Encounter for screening for malignant neoplasm of prostate: Secondary | ICD-10-CM | POA: Diagnosis not present

## 2018-02-08 DIAGNOSIS — Z136 Encounter for screening for cardiovascular disorders: Secondary | ICD-10-CM | POA: Diagnosis not present

## 2018-02-08 DIAGNOSIS — Z1159 Encounter for screening for other viral diseases: Secondary | ICD-10-CM | POA: Diagnosis not present

## 2018-02-08 DIAGNOSIS — Z23 Encounter for immunization: Secondary | ICD-10-CM | POA: Diagnosis not present

## 2018-02-08 DIAGNOSIS — I10 Essential (primary) hypertension: Secondary | ICD-10-CM | POA: Diagnosis not present

## 2018-02-08 DIAGNOSIS — Z0000 Encounter for general adult medical examination without abnormal findings: Secondary | ICD-10-CM | POA: Diagnosis not present

## 2018-03-02 DIAGNOSIS — D125 Benign neoplasm of sigmoid colon: Secondary | ICD-10-CM | POA: Diagnosis not present

## 2018-03-02 DIAGNOSIS — Z1211 Encounter for screening for malignant neoplasm of colon: Secondary | ICD-10-CM | POA: Diagnosis not present

## 2018-03-02 DIAGNOSIS — K648 Other hemorrhoids: Secondary | ICD-10-CM | POA: Diagnosis not present

## 2018-03-02 DIAGNOSIS — K644 Residual hemorrhoidal skin tags: Secondary | ICD-10-CM | POA: Diagnosis not present

## 2018-03-02 DIAGNOSIS — D122 Benign neoplasm of ascending colon: Secondary | ICD-10-CM | POA: Diagnosis not present

## 2018-03-06 DIAGNOSIS — D125 Benign neoplasm of sigmoid colon: Secondary | ICD-10-CM | POA: Diagnosis not present

## 2018-03-06 DIAGNOSIS — D122 Benign neoplasm of ascending colon: Secondary | ICD-10-CM | POA: Diagnosis not present

## 2018-03-06 DIAGNOSIS — Z1211 Encounter for screening for malignant neoplasm of colon: Secondary | ICD-10-CM | POA: Diagnosis not present

## 2018-12-31 DIAGNOSIS — Z79899 Other long term (current) drug therapy: Secondary | ICD-10-CM | POA: Diagnosis not present

## 2018-12-31 DIAGNOSIS — I10 Essential (primary) hypertension: Secondary | ICD-10-CM | POA: Diagnosis not present

## 2018-12-31 DIAGNOSIS — N183 Chronic kidney disease, stage 3 (moderate): Secondary | ICD-10-CM | POA: Diagnosis not present

## 2019-01-07 DIAGNOSIS — N183 Chronic kidney disease, stage 3 (moderate): Secondary | ICD-10-CM | POA: Diagnosis not present

## 2020-02-06 DIAGNOSIS — N183 Chronic kidney disease, stage 3 unspecified: Secondary | ICD-10-CM | POA: Diagnosis not present

## 2020-02-06 DIAGNOSIS — D649 Anemia, unspecified: Secondary | ICD-10-CM | POA: Diagnosis not present

## 2020-02-06 DIAGNOSIS — Z125 Encounter for screening for malignant neoplasm of prostate: Secondary | ICD-10-CM | POA: Diagnosis not present

## 2020-02-06 DIAGNOSIS — I10 Essential (primary) hypertension: Secondary | ICD-10-CM | POA: Diagnosis not present

## 2020-02-06 DIAGNOSIS — N529 Male erectile dysfunction, unspecified: Secondary | ICD-10-CM | POA: Diagnosis not present

## 2020-08-12 DIAGNOSIS — Z0000 Encounter for general adult medical examination without abnormal findings: Secondary | ICD-10-CM | POA: Diagnosis not present

## 2020-08-12 DIAGNOSIS — I10 Essential (primary) hypertension: Secondary | ICD-10-CM | POA: Diagnosis not present

## 2020-08-12 DIAGNOSIS — N529 Male erectile dysfunction, unspecified: Secondary | ICD-10-CM | POA: Diagnosis not present

## 2020-08-12 DIAGNOSIS — E782 Mixed hyperlipidemia: Secondary | ICD-10-CM | POA: Diagnosis not present

## 2020-08-12 DIAGNOSIS — N183 Chronic kidney disease, stage 3 unspecified: Secondary | ICD-10-CM | POA: Diagnosis not present

## 2020-08-13 DIAGNOSIS — Z125 Encounter for screening for malignant neoplasm of prostate: Secondary | ICD-10-CM | POA: Diagnosis not present

## 2020-08-13 DIAGNOSIS — E782 Mixed hyperlipidemia: Secondary | ICD-10-CM | POA: Diagnosis not present

## 2020-08-13 DIAGNOSIS — I10 Essential (primary) hypertension: Secondary | ICD-10-CM | POA: Diagnosis not present

## 2020-08-13 DIAGNOSIS — N183 Chronic kidney disease, stage 3 unspecified: Secondary | ICD-10-CM | POA: Diagnosis not present

## 2020-10-06 ENCOUNTER — Ambulatory Visit: Payer: BC Managed Care – PPO | Attending: Internal Medicine

## 2020-10-06 DIAGNOSIS — Z20822 Contact with and (suspected) exposure to covid-19: Secondary | ICD-10-CM | POA: Insufficient documentation

## 2020-10-07 LAB — SARS-COV-2, NAA 2 DAY TAT

## 2020-10-07 LAB — NOVEL CORONAVIRUS, NAA: SARS-CoV-2, NAA: NOT DETECTED

## 2020-11-10 DIAGNOSIS — N183 Chronic kidney disease, stage 3 unspecified: Secondary | ICD-10-CM | POA: Diagnosis not present

## 2020-11-10 DIAGNOSIS — I4891 Unspecified atrial fibrillation: Secondary | ICD-10-CM | POA: Diagnosis not present

## 2020-11-10 DIAGNOSIS — R42 Dizziness and giddiness: Secondary | ICD-10-CM | POA: Diagnosis not present

## 2020-11-10 DIAGNOSIS — I10 Essential (primary) hypertension: Secondary | ICD-10-CM | POA: Diagnosis not present

## 2020-11-10 DIAGNOSIS — E782 Mixed hyperlipidemia: Secondary | ICD-10-CM | POA: Diagnosis not present

## 2020-11-12 ENCOUNTER — Telehealth: Payer: Self-pay | Admitting: *Deleted

## 2020-11-12 NOTE — Telephone Encounter (Signed)
Call from Dr. Shari Prows received to schedule this pt for her to see as new pt consult, at very next available appt.  New pt consult will be for new onset afib, diagnosed by the pts PCP.  Scheduled the pt to come in and see Dr. Shari Prows as a new pt for 11/23/20 at 2:20 pm.  He is aware to arrive 15 mins prior to this appt, and to get in touch with his PCP, to have records faxed over to our office at [phone].  Will also send chart prep a message about new pt appt, so that they can obtain appropriate records.  Pt agreed to plan.

## 2020-11-12 NOTE — Progress Notes (Deleted)
Cardiology Office Note:    Date:  11/12/2020   ID:  Jesse Rice, DOB 1955/06/22, MRN [id number]  PCP:  Marcelo Baldy, PA-C   [facility] Providers Cardiologist:  None {   Referring MD: Marcelo Baldy, PA-C    History of Present Illness:    Jesse Rice is a 65 y.o. male with a hx of HTN and newly diagnosed atrial fibrillation who was referred by [name] PA-C for further evaluation of newly diagnosed Afib.    Past Medical History:  Diagnosis Date   Hypertension    Hypertensive pulmonary venous disease (HCC) 05/20/2014   IBS (irritable bowel syndrome)     Past Surgical History:  Procedure Laterality Date   CHOLECYSTECTOMY      Current Medications: No outpatient medications have been marked as taking for the 11/23/20 encounter (Appointment) with Meriam Sprague, MD.     Allergies:   Patient has no known allergies.   Social History   Socioeconomic History   Marital status: Single    Spouse name: Not on file   Number of children: Not on file   Years of education: Not on file   Highest education level: Not on file  Occupational History   Not on file  Tobacco Use   Smoking status: Former    Packs/day: 0.50    Pack years: 0.00    Types: Cigarettes   Smokeless tobacco: Never  Substance and Sexual Activity   Alcohol use: No   Drug use: No   Sexual activity: Not on file  Other Topics Concern   Not on file  Social History Narrative   Not on file   Social Determinants of Health   Financial Resource Strain: Not on file  Food Insecurity: Not on file  Transportation Needs: Not on file  Physical Activity: Not on file  Stress: Not on file  Social Connections: Not on file     Family History: The patient's ***family history includes Arrhythmia in his mother; COPD in his father; Diabetes in his father; Heart murmur in his father; Hypertension in his father and mother; Rheumatic fever in his father.  ROS:   Please see the history of  present illness.    *** All other systems reviewed and are negative.  EKGs/Labs/Other Studies Reviewed:    The following studies were reviewed today: TTE 2013-09-24: Study Conclusions   - Left ventricle: The cavity size was normal. Wall thickness was    increased in a pattern of mild LVH. There was focal basal    hypertrophy. Systolic function was normal. The estimated ejection    fraction was in the range of 60% to 65%.  EKG:  EKG is *** ordered today.  The ekg ordered today demonstrates ***  Recent Labs: No results found for requested labs within last 8760 hours.  Recent Lipid Panel No results found for: CHOL, TRIG, HDL, CHOLHDL, VLDL, LDLCALC, LDLDIRECT   Risk Assessment/Calculations:   {Does this patient have ATRIAL FIBRILLATION?:[phone]}       Physical Exam:    VS:  There were no vitals taken for this visit.    Wt Readings from Last 3 Encounters:  06/05/17 269 lb (122 kg)  12/08/16 269 lb (122 kg)  05/20/14 269 lb 12.8 oz (122.4 kg)     GEN: *** Well nourished, well developed in no acute distress HEENT: Normal NECK: No JVD; No carotid bruits LYMPHATICS: No lymphadenopathy CARDIAC: ***RRR, no murmurs, rubs, gallops RESPIRATORY:  Clear to auscultation without  rales, wheezing or rhonchi  ABDOMEN: Soft, non-tender, non-distended MUSCULOSKELETAL:  No edema; No deformity  SKIN: Warm and dry NEUROLOGIC:  Alert and oriented x 3 PSYCHIATRIC:  Normal affect   ASSESSMENT:    No diagnosis found. PLAN:    In order of problems listed above:  #Newly Diagnosed Paroxysmal Afib: CHADs-vasc 1.  -Start apixaban 5mg  BID -Start metop 25mg  XL daily -Check TTE -Check TSH -Stop ASA  #HTN: Controlled. -Continue lisinopril-HCTZ 20-25mg  daily  {Are you ordering a CV Procedure (e.g. stress test, cath, DCCV, TEE, etc)?   Press F2        :    Medication Adjustments/Labs and Tests Ordered: Current medicines are reviewed at length with the patient today.  Concerns  regarding medicines are outlined above.  No orders of the defined types were placed in this encounter.  No orders of the defined types were placed in this encounter.   There are no Patient Instructions on file for this visit.   Signed, , MD  11/12/2020 6:24 PM    [facility]

## 2020-11-20 ENCOUNTER — Telehealth: Payer: Self-pay | Admitting: *Deleted

## 2020-11-20 NOTE — Telephone Encounter (Signed)
Requested notes from referral PCP.Marland KitchenMarland KitchenMarland KitchenChart prep md...Marland KitchenMarland Kitchen

## 2020-11-23 ENCOUNTER — Ambulatory Visit (INDEPENDENT_AMBULATORY_CARE_PROVIDER_SITE_OTHER): Payer: BC Managed Care – PPO | Admitting: Cardiology

## 2020-11-23 ENCOUNTER — Encounter: Payer: Self-pay | Admitting: Cardiology

## 2020-11-23 ENCOUNTER — Other Ambulatory Visit: Payer: Self-pay

## 2020-11-23 VITALS — BP 130/72 | HR 105 | Ht 76.0 in | Wt 298.0 lb

## 2020-11-23 DIAGNOSIS — I10 Essential (primary) hypertension: Secondary | ICD-10-CM | POA: Diagnosis not present

## 2020-11-23 DIAGNOSIS — G4733 Obstructive sleep apnea (adult) (pediatric): Secondary | ICD-10-CM | POA: Diagnosis not present

## 2020-11-23 DIAGNOSIS — I4891 Unspecified atrial fibrillation: Secondary | ICD-10-CM

## 2020-11-23 MED ORDER — APIXABAN 5 MG PO TABS: 5.0000 mg | ORAL_TABLET | Freq: Two times a day (BID) | ORAL | 2 refills | Status: DC

## 2020-11-23 MED ORDER — METOPROLOL SUCCINATE ER 25 MG PO TB24: 25.0000 mg | ORAL_TABLET | Freq: Every day | ORAL | 1 refills | Status: DC

## 2020-11-23 NOTE — Patient Instructions (Addendum)
Medication Instructions:   STOP TAKING ASPIRIN NOW  START TAKING ELIQUIS 5 MG BY MOUTH TWICE DAILY  START TAKING METOPROLOL SUCCINATE (TOPROL XL) 25 MG BY MOUTH DAILY  *If you need a refill on your cardiac medications before your next appointment, please call your pharmacy*   Testing/Procedures:  Your physician has requested that you have an echocardiogram. Echocardiography is a painless test that uses sound waves to create images of your heart. It provides your doctor with information about the size and shape of your heart and how well your heart's chambers and valves are working. This procedure takes approximately one hour. There are no restrictions for this procedure.  Your physician has recommended that you have a sleep study. This test records several body functions during sleep, including: brain activity, eye movement, oxygen and carbon dioxide blood levels, heart rate and rhythm, breathing rate and rhythm, the flow of air through your mouth and nose, snoring, body muscle movements, and chest and belly movement.  YOU WILL BE CONTACTED BY OUR SLEEP COORDINATOR NINA JONES TO ARRANGE YOUR SLEEP STUDY   Follow-Up:  NURSE VISIT EKG IN 3 WEEKS--SCHEDULE EITHER ON 7/19 OR 7/20  3 MONTHS IN THE OFFICE WITH AN EXTENDER

## 2020-11-23 NOTE — Progress Notes (Deleted)
Cardiology Office Note:    Date:  11/23/2020   ID:  Jesse Rice, DOB 11-14-1955, MRN [id number]  PCP:  Soundra Pilon, FNP   [facility] Providers Cardiologist:  None {   Referring MD: Marcelo Baldy, PA-C    History of Present Illness:    Jesse Rice is a 65 y.o. male with a hx of HTN and newly diagnosed atrial fibrillation who was referred by [name] PA-C for further evaluation of newly diagnosed Afib.  Today, he is feeling overall okay aside from generally feeling "run-down". He reports having sudden episodes of lightheadedness in the past few weeks as well as low energy. He saw his PCP and was found to be in new atrial fibrillation. His antihypertensives were adjusted at that time and he was referred to Cardiology clinic today.  Otherwise, the patient is doing well. No known history of CAD. He has never participated in a sleep study, and has not been told he snores or breathes irregularly at night. He denies any chest pain, shortness of breath, palpitations, or exertional symptoms. No headaches, or syncope to report. Also has no lower extremity edema, orthopnea or PND.   Past Medical History:  Diagnosis Date   Hypertension    Hypertensive pulmonary venous disease (HCC) 05/20/2014   IBS (irritable bowel syndrome)     Past Surgical History:  Procedure Laterality Date   CHOLECYSTECTOMY      Current Medications: Current Meds  Medication Sig   apixaban (ELIQUIS) 5 MG TABS tablet Take 1 tablet (5 mg total) by mouth 2 (two) times daily.   ferrous sulfate 325 (65 FE) MG tablet Take 1 tablet by mouth daily.   lisinopril-hydrochlorothiazide (PRINZIDE,ZESTORETIC) 20-25 MG per tablet Take 1 tablet by mouth daily.    loperamide (IMODIUM) 2 MG capsule Take 2 mg by mouth as needed for diarrhea or loose stools.   metoprolol succinate (TOPROL XL) 25 MG 24 hr tablet Take 1 tablet (25 mg total) by mouth daily.   [DISCONTINUED] aspirin EC 81 MG tablet Take 81 mg by  mouth daily.     Allergies:   Patient has no known allergies.   Social History   Socioeconomic History   Marital status: Single    Spouse name: Not on file   Number of children: Not on file   Years of education: Not on file   Highest education level: Not on file  Occupational History   Not on file  Tobacco Use   Smoking status: Former    Packs/day: 0.50    Pack years: 0.00    Types: Cigarettes   Smokeless tobacco: Never  Substance and Sexual Activity   Alcohol use: No   Drug use: No   Sexual activity: Not on file  Other Topics Concern   Not on file  Social History Narrative   Not on file   Social Determinants of Health   Financial Resource Strain: Not on file  Food Insecurity: Not on file  Transportation Needs: Not on file  Physical Activity: Not on file  Stress: Not on file  Social Connections: Not on file     Family History: The patient's family history includes Arrhythmia in his mother; COPD in his father; Diabetes in his father; Heart murmur in his father; Hypertension in his father and mother; Rheumatic fever in his father.  ROS:   Review of Systems  Constitutional:  Positive for malaise/fatigue. Negative for chills and fever.  HENT:  Negative for hearing loss and  nosebleeds.   Eyes:  Negative for double vision and discharge.  Respiratory:  Negative for cough and shortness of breath.   Cardiovascular:  Negative for chest pain, palpitations, orthopnea, claudication, leg swelling and PND.  Gastrointestinal:  Negative for constipation, nausea and vomiting.  Genitourinary:  Negative for dysuria and flank pain.  Musculoskeletal:  Negative for myalgias and neck pain.  Neurological:  Positive for dizziness. Negative for speech change and seizures.  Endo/Heme/Allergies:  Negative for environmental allergies.  Psychiatric/Behavioral:  Negative for depression and suicidal ideas.     EKGs/Labs/Other Studies Reviewed:    The following studies were reviewed  today: TTE 2015: Study Conclusions   - Left ventricle: The cavity size was normal. Wall thickness was    increased in a pattern of mild LVH. There was focal basal    hypertrophy. Systolic function was normal. The estimated ejection    fraction was in the range of 60% to 65%.  EKG:   11/23/2020: Atrial fibrillation, rate-related LBBB, HR 91  Recent Labs: No results found for requested labs within last 8760 hours.  Recent Lipid Panel No results found for: CHOL, TRIG, HDL, CHOLHDL, VLDL, LDLCALC, LDLDIRECT   Risk Assessment/Calculations:    CHA2DS2-VASc Score = 1  This indicates a 0.6% annual risk of stroke. The patient's score is based upon: CHF History: No HTN History: Yes Diabetes History: No Stroke History: No Vascular Disease History: No Age Score: 0 Gender Score: 0          Physical Exam:    VS:  BP 130/72   Pulse (!) 105   Ht 6\' 4"  (1.93 m)   Wt 298 lb (135.2 kg)   SpO2 97%   BMI 36.27 kg/m     Wt Readings from Last 3 Encounters:  11/23/20 298 lb (135.2 kg)  06/05/17 269 lb (122 kg)  12/08/16 269 lb (122 kg)     GEN: Well nourished, well developed in no acute distress HEENT: Normal NECK: No JVD; No carotid bruits LYMPHATICS: No lymphadenopathy CARDIAC: Irregularly irregular. No murmurs, rubs, gallops RESPIRATORY:  Clear to auscultation without rales, wheezing or rhonchi  ABDOMEN: Soft, non-tender, non-distended MUSCULOSKELETAL:  No edema; No deformity  SKIN: Warm and dry NEUROLOGIC:  Alert and oriented x 3 PSYCHIATRIC:  Normal affect   ASSESSMENT:    1. Atrial fibrillation, unspecified type (HCC)   2. OSA (obstructive sleep apnea)   3. Benign essential HTN    PLAN:    In order of problems listed above:  #Newly Diagnosed Paroxysmal Afib: CHADs-vasc 1 for HTN. Symptomatic with fatigue and lightheadedness. Currently remains in Afib today with HR 90-100s. Has rate related LBBB as well. Not currently on any rate controlling agents or AC. -Start  apixaban 5mg  BID -Start metop 25mg  XL daily -Check TTE -Check sleep study -TSH normal  -Stop ASA -Will repeat ECG in 3 weeks and if remains in Afib, plan for DCCV  #HTN: 130/70s today which is near goal of <130s/90s. -Continue lisinopril-HCTZ 20-25mg  daily -Start metoprolol 25mg  XL daily     Follow-up in 3 weeks for repeat EKG. Follow-up with APP/MD in 3 months.  Medication Adjustments/Labs and Tests Ordered: Current medicines are reviewed at length with the patient today.  Concerns regarding medicines are outlined above.  Orders Placed This Encounter  Procedures   EKG 12-Lead   ECHOCARDIOGRAM COMPLETE   Split night study   Meds ordered this encounter  Medications   apixaban (ELIQUIS) 5 MG TABS tablet    Sig: Take  1 tablet (5 mg total) by mouth 2 (two) times daily.    Dispense:  60 tablet    Refill:  2   metoprolol succinate (TOPROL XL) 25 MG 24 hr tablet    Sig: Take 1 tablet (25 mg total) by mouth daily.    Dispense:  90 tablet    Refill:  1    Patient Instructions  Medication Instructions:   STOP TAKING ASPIRIN NOW  START TAKING ELIQUIS 5 MG BY MOUTH TWICE DAILY  START TAKING METOPROLOL SUCCINATE (TOPROL XL) 25 MG BY MOUTH DAILY  *If you need a refill on your cardiac medications before your next appointment, please call your pharmacy*   Testing/Procedures:  Your physician has requested that you have an echocardiogram. Echocardiography is a painless test that uses sound waves to create images of your heart. It provides your doctor with information about the size and shape of your heart and how well your heart's chambers and valves are working. This procedure takes approximately one hour. There are no restrictions for this procedure.  Your physician has recommended that you have a sleep study. This test records several body functions during sleep, including: brain activity, eye movement, oxygen and carbon dioxide blood levels, heart rate and rhythm, breathing rate  and rhythm, the flow of air through your mouth and nose, snoring, body muscle movements, and chest and belly movement.  YOU WILL BE CONTACTED BY OUR SLEEP COORDINATOR NINA JONES TO ARRANGE YOUR SLEEP STUDY   Follow-Up:  NURSE VISIT EKG IN 3 WEEKS--SCHEDULE EITHER ON 7/19 OR 7/20  3 MONTHS IN THE OFFICE WITH AN EXTENDER    I,Mathew Stumpf,acting as a scribe for Meriam Sprague, MD.,have documented all relevant documentation on the behalf of Meriam Sprague, MD,as directed by  Meriam Sprague, MD while in the presence of Meriam Sprague, MD.  I, Meriam Sprague, MD, have reviewed all documentation for this visit. The documentation on 11/23/20 for the exam, diagnosis, procedures, and orders are all accurate and complete.   Signed, Meriam Sprague, MD  11/23/2020 4:19 PM     [facility]

## 2020-11-24 ENCOUNTER — Telehealth: Payer: Self-pay | Admitting: *Deleted

## 2020-11-24 ENCOUNTER — Telehealth: Payer: Self-pay

## 2020-11-24 NOTE — Telephone Encounter (Signed)
**Note De-Identified Jesse Rice Obfuscation** I started a Eliquis PA through covermymeds. Key: [id number]

## 2020-11-24 NOTE — Telephone Encounter (Signed)
Prior Authorization for SPLIT NIGHT sent to [facility]  via Phone. Reference # [id number].

## 2020-11-24 NOTE — Progress Notes (Signed)
Cardiology Office Note:    Date:  11/24/2020   ID:  Jesse Rice, DOB Feb 06, 1956, MRN [id number]  PCP:  Soundra Pilon, FNP   [facility] Providers Cardiologist:  None {   Referring MD: Marcelo Baldy, PA-C    History of Present Illness:    Jesse Rice is a 65 y.o. male with a hx of HTN and newly diagnosed atrial fibrillation who was referred by [name] PA-C for further evaluation of newly diagnosed Afib.  Today, he is feeling overall okay aside from generally feeling "run-down". He reports having sudden episodes of lightheadedness in the past few weeks as well as low energy. He saw his PCP and was found to be in new atrial fibrillation. His antihypertensives were adjusted at that time and he was referred to Cardiology clinic today.  Otherwise, the patient is doing well. No known history of CAD. He has never participated in a sleep study, and has not been told he snores or breathes irregularly at night. He denies any chest pain, shortness of breath, palpitations, or exertional symptoms. No headaches, or syncope to report. Also has no lower extremity edema, orthopnea or PND.   Past Medical History:  Diagnosis Date   Hypertension    Hypertensive pulmonary venous disease (HCC) 05/20/2014   IBS (irritable bowel syndrome)     Past Surgical History:  Procedure Laterality Date   CHOLECYSTECTOMY      Current Medications: Current Meds  Medication Sig   apixaban (ELIQUIS) 5 MG TABS tablet Take 1 tablet (5 mg total) by mouth 2 (two) times daily.   ferrous sulfate 325 (65 FE) MG tablet Take 1 tablet by mouth daily.   lisinopril-hydrochlorothiazide (PRINZIDE,ZESTORETIC) 20-25 MG per tablet Take 1 tablet by mouth daily.    loperamide (IMODIUM) 2 MG capsule Take 2 mg by mouth as needed for diarrhea or loose stools.   metoprolol succinate (TOPROL XL) 25 MG 24 hr tablet Take 1 tablet (25 mg total) by mouth daily.   [DISCONTINUED] aspirin EC 81 MG tablet Take 81 mg by  mouth daily.     Allergies:   Patient has no known allergies.   Social History   Socioeconomic History   Marital status: Single    Spouse name: Not on file   Number of children: Not on file   Years of education: Not on file   Highest education level: Not on file  Occupational History   Not on file  Tobacco Use   Smoking status: Former    Packs/day: 0.50    Pack years: 0.00    Types: Cigarettes   Smokeless tobacco: Never  Substance and Sexual Activity   Alcohol use: No   Drug use: No   Sexual activity: Not on file  Other Topics Concern   Not on file  Social History Narrative   Not on file   Social Determinants of Health   Financial Resource Strain: Not on file  Food Insecurity: Not on file  Transportation Needs: Not on file  Physical Activity: Not on file  Stress: Not on file  Social Connections: Not on file     Family History: The patient's family history includes Arrhythmia in his mother; COPD in his father; Diabetes in his father; Heart murmur in his father; Hypertension in his father and mother; Rheumatic fever in his father.  ROS:   Review of Systems  Constitutional:  Positive for malaise/fatigue. Negative for chills and fever.  HENT:  Negative for hearing loss and  nosebleeds.   Eyes:  Negative for double vision and discharge.  Respiratory:  Negative for cough and shortness of breath.   Cardiovascular:  Negative for chest pain, palpitations, orthopnea, claudication, leg swelling and PND.  Gastrointestinal:  Negative for constipation, nausea and vomiting.  Genitourinary:  Negative for dysuria and flank pain.  Musculoskeletal:  Negative for myalgias and neck pain.  Neurological:  Positive for dizziness. Negative for speech change and seizures.  Endo/Heme/Allergies:  Negative for environmental allergies.  Psychiatric/Behavioral:  Negative for depression and suicidal ideas.     EKGs/Labs/Other Studies Reviewed:    The following studies were reviewed  today: TTE 2015: Study Conclusions   - Left ventricle: The cavity size was normal. Wall thickness was    increased in a pattern of mild LVH. There was focal basal    hypertrophy. Systolic function was normal. The estimated ejection    fraction was in the range of 60% to 65%.  EKG:   11/23/2020: Atrial fibrillation, rate-related LBBB, HR 91  Recent Labs: No results found for requested labs within last 8760 hours.  Recent Lipid Panel No results found for: CHOL, TRIG, HDL, CHOLHDL, VLDL, LDLCALC, LDLDIRECT   Risk Assessment/Calculations:    CHA2DS2-VASc Score = 1  This indicates a 0.6% annual risk of stroke. The patient's score is based upon: CHF History: No HTN History: Yes Diabetes History: No Stroke History: No Vascular Disease History: No Age Score: 0 Gender Score: 0           Physical Exam:    VS:  BP 130/72   Pulse (!) 105   Ht 6\' 4"  (1.93 m)   Wt 298 lb (135.2 kg)   SpO2 97%   BMI 36.27 kg/m     Wt Readings from Last 3 Encounters:  11/23/20 298 lb (135.2 kg)  06/05/17 269 lb (122 kg)  12/08/16 269 lb (122 kg)     GEN: Well nourished, well developed in no acute distress HEENT: Normal NECK: No JVD; No carotid bruits LYMPHATICS: No lymphadenopathy CARDIAC: Irregularly irregular. No murmurs, rubs, gallops RESPIRATORY:  Clear to auscultation without rales, wheezing or rhonchi  ABDOMEN: Soft, non-tender, non-distended MUSCULOSKELETAL:  No edema; No deformity  SKIN: Warm and dry NEUROLOGIC:  Alert and oriented x 3 PSYCHIATRIC:  Normal affect   ASSESSMENT:    1. Atrial fibrillation, unspecified type (HCC)   2. OSA (obstructive sleep apnea)   3. Benign essential HTN    PLAN:    In order of problems listed above:  #Newly Diagnosed Paroxysmal Afib: CHADs-vasc 1 for HTN. Symptomatic with fatigue and lightheadedness. Currently remains in Afib today with HR 90-100s. Has rate related LBBB as well. Not currently on any rate controlling agents or  AC. -Start apixaban 5mg  BID -Start metop 25mg  XL daily -Check TTE -Check sleep study -TSH normal  -Stop ASA -Will repeat ECG in 3 weeks and if remains in Afib, plan for DCCV  #HTN: 130/70s today which is near goal of <130s/90s. -Continue lisinopril-HCTZ 20-25mg  daily -Start metoprolol 25mg  XL daily     Follow-up in 3 weeks for repeat EKG. Follow-up with APP/MD in 3 months.  Medication Adjustments/Labs and Tests Ordered: Current medicines are reviewed at length with the patient today.  Concerns regarding medicines are outlined above.  Orders Placed This Encounter  Procedures   EKG 12-Lead   ECHOCARDIOGRAM COMPLETE   Split night study   Meds ordered this encounter  Medications   apixaban (ELIQUIS) 5 MG TABS tablet    Sig:  Take 1 tablet (5 mg total) by mouth 2 (two) times daily.    Dispense:  60 tablet    Refill:  2   metoprolol succinate (TOPROL XL) 25 MG 24 hr tablet    Sig: Take 1 tablet (25 mg total) by mouth daily.    Dispense:  90 tablet    Refill:  1    Patient Instructions  Medication Instructions:   STOP TAKING ASPIRIN NOW  START TAKING ELIQUIS 5 MG BY MOUTH TWICE DAILY  START TAKING METOPROLOL SUCCINATE (TOPROL XL) 25 MG BY MOUTH DAILY  *If you need a refill on your cardiac medications before your next appointment, please call your pharmacy*   Testing/Procedures:  Your physician has requested that you have an echocardiogram. Echocardiography is a painless test that uses sound waves to create images of your heart. It provides your doctor with information about the size and shape of your heart and how well your heart's chambers and valves are working. This procedure takes approximately one hour. There are no restrictions for this procedure.  Your physician has recommended that you have a sleep study. This test records several body functions during sleep, including: brain activity, eye movement, oxygen and carbon dioxide blood levels, heart rate and rhythm,  breathing rate and rhythm, the flow of air through your mouth and nose, snoring, body muscle movements, and chest and belly movement.  YOU WILL BE CONTACTED BY OUR SLEEP COORDINATOR NINA JONES TO ARRANGE YOUR SLEEP STUDY   Follow-Up:  NURSE VISIT EKG IN 3 WEEKS--SCHEDULE EITHER ON 7/19 OR 7/20  3 MONTHS IN THE OFFICE WITH AN EXTENDER     I,Mathew Stumpf,acting as a scribe for Meriam Sprague, MD.,have documented all relevant documentation on the behalf of Meriam Sprague, MD,as directed by  Meriam Sprague, MD while in the presence of Meriam Sprague, MD.  I, Meriam Sprague, MD, have reviewed all documentation for this visit. The documentation on 11/24/20 for the exam, diagnosis, procedures, and orders are all accurate and complete.   Signed, Meriam Sprague, MD  11/24/2020 12:37 PM    [facility]

## 2020-11-24 NOTE — Telephone Encounter (Signed)
-----   Message from Loa Socks, LPN sent at 4/49/6759  3:42 PM EDT ----- Regarding: SPLIT NIGHT SLEEP STUDY PER [place] Per Dr. Shari Prows this pt needs to be set up for a split night sleep study for OSA.  Order is in and pt is aware you will call to arrange. Can you please arrange and shoot me the date?  Thanks, [employer]

## 2020-11-24 NOTE — Addendum Note (Signed)
Addended by: Laurance Flatten on: 11/24/2020 09:35 AM   Modules accepted: Level of Service

## 2020-11-25 NOTE — Telephone Encounter (Signed)
**Note De-Identified Teegan Guinther Obfuscation** Letter received from [facility] stating that they have approved the pts Eliquis PA until 11/24/2021.  I have notified [facility] of this approval.

## 2020-12-15 ENCOUNTER — Encounter: Payer: Self-pay | Admitting: Cardiology

## 2020-12-15 ENCOUNTER — Other Ambulatory Visit: Payer: Self-pay

## 2020-12-15 ENCOUNTER — Ambulatory Visit (INDEPENDENT_AMBULATORY_CARE_PROVIDER_SITE_OTHER): Payer: BC Managed Care – PPO | Admitting: Cardiology

## 2020-12-15 ENCOUNTER — Ambulatory Visit: Payer: BC Managed Care – PPO

## 2020-12-15 VITALS — BP 130/82 | HR 86 | Ht 76.0 in | Wt 296.2 lb

## 2020-12-15 DIAGNOSIS — I4891 Unspecified atrial fibrillation: Secondary | ICD-10-CM

## 2020-12-15 DIAGNOSIS — G4733 Obstructive sleep apnea (adult) (pediatric): Secondary | ICD-10-CM

## 2020-12-15 DIAGNOSIS — I10 Essential (primary) hypertension: Secondary | ICD-10-CM

## 2020-12-15 NOTE — Patient Instructions (Signed)
Medication Instructions:   Your physician recommends that you continue on your current medications as directed. Please refer to the Current Medication list given to you today.  *If you need a refill on your cardiac medications before your next appointment, please call your pharmacy*  Testing/Procedures:   You are scheduled for a TEE/Cardioversion/TEE Cardioversion on Wednesday 12/23/20 with Dr. Shari Prows.  Please arrive at the [facility] (Main Entrance A) at [facility]: [address] [place], [place] [address] at 8:30 AM (1 hour prior to procedure unless lab work is needed; if lab work is needed arrive 1.5 hours ahead)  DIET: Nothing to eat or drink after midnight except a sip of water with medications (see medication instructions below)  FYI: For your safety, and to allow [place] to monitor your vital signs accurately during the surgery/procedure we request that   if you have artificial nails, gel coating, SNS etc. Please have those removed prior to your surgery/procedure. Not having the nail coverings /polish removed may result in cancellation or delay of your surgery/procedure.   Medication Instructions: Hold ZESTORETIC   Continue your anticoagulant: ELIQUIS WITH NO SKIPPED DOSES You will need to continue your anticoagulant after your procedure until you are told by your  Provider that it is safe to stop   Labs: If patient is on Coumadin, patient needs pt/INR, CBC, BMET within 3 days (No pt/INR needed for patients taking Xarelto, Eliquis, Pradaxa) For patients receiving anesthesia for TEE and all Cardioversion patients: BMET, CBC within 1 week  Come to: HOSPITAL ON 12/23/20 AT 8:30 AM--YOU WILL HAVE LABS DONE THEN YOUR TEE/CARDIOVERSION THEREAFTER  (Lab option #3) your lab work will be done at the hospital prior to your procedure - you will need to arrive 1  hours ahead of your procedure  You must have a responsible person to drive you home and stay in the waiting area during  your procedure. Failure to do so could result in cancellation.  Bring your insurance cards.  *Special Note: Every effort is made to have your procedure done on time. Occasionally there are emergencies that occur at the hospital that may cause delays. Please be patient if a delay does occur.     Follow-Up:  AS PREVIOUSLY SCHEDULED--ECHO AND FOLLOW-UP APPOINTMENT AS ALREADY SCHEDULED

## 2020-12-15 NOTE — H&P (View-Only) (Signed)
Cardiology Office Note:    Date:  12/15/2020   ID:  Jesse Rice, DOB [birth year], MRN [id number]  PCP:  Soundra Pilon, FNP   [facility] Providers Cardiologist:  None {   Referring MD: Soundra Pilon, FNP    History of Present Illness:    Jesse Rice is a 65 y.o. male with a hx of HTN and newly diagnosed atrial fibrillation who presents to clinic for follow-up of his Afib.  Patient initially seen on 11/23/20 where he remained in Afib with associated fatigue. We started him on metoprolol and apixaban at that time. TTE pending.  Today, the patient states that he continues to feel tired. No chest pain, SOB, lightheadedness or syncope. Admits to missing a dose of his eliquis this weekend but otherwise has been compliant. Denies any bleeding issues. We discussed the option of waiting 3 weeks for DCCV vs TEE/DCCV and given symptoms, will proceed with TEE/DCCV at this time.    Past Medical History:  Diagnosis Date   Hypertension    Hypertensive pulmonary venous disease (HCC) 05/20/2014   IBS (irritable bowel syndrome)     Past Surgical History:  Procedure Laterality Date   CHOLECYSTECTOMY      Current Medications: Current Meds  Medication Sig   apixaban (ELIQUIS) 5 MG TABS tablet Take 1 tablet (5 mg total) by mouth 2 (two) times daily.   ferrous sulfate 325 (65 FE) MG tablet Take 1 tablet by mouth daily.   lisinopril-hydrochlorothiazide (PRINZIDE,ZESTORETIC) 20-25 MG per tablet Take 1 tablet by mouth daily.    loperamide (IMODIUM) 2 MG capsule Take 2 mg by mouth as needed for diarrhea or loose stools.   metoprolol succinate (TOPROL XL) 25 MG 24 hr tablet Take 1 tablet (25 mg total) by mouth daily.     Allergies:   Patient has no known allergies.   Social History   Socioeconomic History   Marital status: Single    Spouse name: Not on file   Number of children: Not on file   Years of education: Not on file   Highest education level: Not on file   Occupational History   Not on file  Tobacco Use   Smoking status: Former    Packs/day: 0.50    Types: Cigarettes   Smokeless tobacco: Never  Substance and Sexual Activity   Alcohol use: No   Drug use: No   Sexual activity: Not on file  Other Topics Concern   Not on file  Social History Narrative   Not on file   Social Determinants of Health   Financial Resource Strain: Not on file  Food Insecurity: Not on file  Transportation Needs: Not on file  Physical Activity: Not on file  Stress: Not on file  Social Connections: Not on file     Family History: The patient's family history includes Arrhythmia in his mother; COPD in his father; Diabetes in his father; Heart murmur in his father; Hypertension in his father and mother; Rheumatic fever in his father.  ROS:   Review of Systems  Constitutional:  Positive for malaise/fatigue. Negative for chills and fever.  HENT:  Negative for hearing loss and nosebleeds.   Eyes:  Negative for double vision and discharge.  Respiratory:  Negative for cough and shortness of breath.   Cardiovascular:  Negative for chest pain, palpitations, orthopnea, claudication, leg swelling and PND.  Gastrointestinal:  Negative for blood in stool, melena, nausea and vomiting.  Genitourinary:  Negative for flank pain  and hematuria.  Musculoskeletal:  Negative for myalgias and neck pain.  Neurological:  Negative for dizziness, speech change and seizures.  Endo/Heme/Allergies:  Negative for environmental allergies.  Psychiatric/Behavioral:  Negative for depression and suicidal ideas.     EKGs/Labs/Other Studies Reviewed:    The following studies were reviewed today: TTE Oct 06, 2013: Study Conclusions   - Left ventricle: The cavity size was normal. Wall thickness was    increased in a pattern of mild LVH. There was focal basal    hypertrophy. Systolic function was normal. The estimated ejection    fraction was in the range of 60% to 65%.  EKG:   11/23/2020:  Atrial fibrillation, rate-related LBBB, HR 91  Recent Labs: No results found for requested labs within last 8760 hours.  Recent Lipid Panel No results found for: CHOL, TRIG, HDL, CHOLHDL, VLDL, LDLCALC, LDLDIRECT   Risk Assessment/Calculations:    CHA2DS2-VASc Score = 1  This indicates a 0.6% annual risk of stroke. The patient's score is based upon: CHF History: No HTN History: Yes Diabetes History: No Stroke History: No Vascular Disease History: No Age Score: 0 Gender Score: 0           Physical Exam:    VS:  BP 130/82   Pulse 86   Ht 6\' 4"  (1.93 m)   Wt 296 lb 3.2 oz (134.4 kg)   SpO2 98%   BMI 36.05 kg/m     Wt Readings from Last 3 Encounters:  12/15/20 296 lb 3.2 oz (134.4 kg)  11/23/20 298 lb (135.2 kg)  06/05/17 269 lb (122 kg)     GEN: Well nourished, well developed in no acute distress HEENT: Normal NECK: No JVD; No carotid bruits CARDIAC: Irregularly irregular. No murmurs RESPIRATORY:  CTAB, no wheezes ABDOMEN: Soft, non-tender, non-distended MUSCULOSKELETAL:  No edema; No deformity  SKIN: Warm and dry NEUROLOGIC:  Alert and oriented x 3 PSYCHIATRIC:  Normal affect   ASSESSMENT:    1. Atrial fibrillation, unspecified type (HCC)    PLAN:    In order of problems listed above:  #Newly Diagnosed Paroxysmal Afib: CHADs-vasc 1 for HTN. Symptomatic with fatigue and lightheadedness. Currently remains in Afib today with HR 90-100s. Has rate related LBBB as well. Unfortunately has missed a dose of his apixaban and therefore will plan on TEE/DCCV -Plan TEE/DCCV as he has missed his apixaban -Continue apixaban 5mg  BID -Continue metop 25mg  XL daily -TTE on Thursday -Check sleep study -TSH normal   #HTN: Controlled.  -Continue lisinopril-HCTZ 20-25mg  daily -Continue metoprolol 25mg  XL daily as above  [facility] has been requested to perform a transesophageal echocardiogram on Jesse Rice for Afib.  After careful review of history and  examination, the risks and benefits of transesophageal echocardiogram have been explained including risks of esophageal damage, perforation (1:10,000 risk), bleeding, pharyngeal hematoma as well as other potential complications associated with conscious sedation including aspiration, arrhythmia, respiratory failure and death. Alternatives to treatment were discussed, questions were answered. Patient is willing to proceed.    Medication Adjustments/Labs and Tests Ordered: Current medicines are reviewed at length with the patient today.  Concerns regarding medicines are outlined above.  Orders Placed This Encounter  Procedures   EKG 12-Lead   No orders of the defined types were placed in this encounter.   Patient Instructions  Medication Instructions:   Your physician recommends that you continue on your current medications as directed. Please refer to the Current Medication list given to you today.  *If you need  a refill on your cardiac medications before your next appointment, please call your pharmacy*  Testing/Procedures:   You are scheduled for a TEE/Cardioversion/TEE Cardioversion on Wednesday 12/23/20 with Dr. Shari Prows.  Please arrive at the [facility] (Main Entrance A) at [facility]: [address] [place], [place] [address] at 8:30 AM (1 hour prior to procedure unless lab work is needed; if lab work is needed arrive 1.5 hours ahead)  DIET: Nothing to eat or drink after midnight except a sip of water with medications (see medication instructions below)  FYI: For your safety, and to allow [place] to monitor your vital signs accurately during the surgery/procedure we request that   if you have artificial nails, gel coating, SNS etc. Please have those removed prior to your surgery/procedure. Not having the nail coverings /polish removed may result in cancellation or delay of your surgery/procedure.   Medication Instructions: Hold ZESTORETIC   Continue your anticoagulant:  ELIQUIS WITH NO SKIPPED DOSES You will need to continue your anticoagulant after your procedure until you are told by your  Provider that it is safe to stop   Labs: If patient is on Coumadin, patient needs pt/INR, CBC, BMET within 3 days (No pt/INR needed for patients taking Xarelto, Eliquis, Pradaxa) For patients receiving anesthesia for TEE and all Cardioversion patients: BMET, CBC within 1 week  Come to: HOSPITAL ON 12/23/20 AT 8:30 AM--YOU WILL HAVE LABS DONE THEN YOUR TEE/CARDIOVERSION THEREAFTER  (Lab option #3) your lab work will be done at the hospital prior to your procedure - you will need to arrive 1  hours ahead of your procedure  You must have a responsible person to drive you home and stay in the waiting area during your procedure. Failure to do so could result in cancellation.  Bring your insurance cards.  *Special Note: Every effort is made to have your procedure done on time. Occasionally there are emergencies that occur at the hospital that may cause delays. Please be patient if a delay does occur.     Follow-Up:  AS PREVIOUSLY SCHEDULED--ECHO AND FOLLOW-UP APPOINTMENT AS ALREADY SCHEDULED     I,Mathew Stumpf,acting as a scribe for Meriam Sprague, MD.,have documented all relevant documentation on the behalf of Meriam Sprague, MD,as directed by  Meriam Sprague, MD while in the presence of Meriam Sprague, MD.  I, Meriam Sprague, MD, have reviewed all documentation for this visit. The documentation on 12/15/20 for the exam, diagnosis, procedures, and orders are all accurate and complete.   Signed, Meriam Sprague, MD  12/15/2020 2:39 PM    [facility]

## 2020-12-15 NOTE — Progress Notes (Signed)
Cardiology Office Note:    Date:  12/15/2020   ID:  Jesse Rice, DOB [birth year], MRN [id number]  PCP:  Soundra Pilon, FNP   [facility] Providers Cardiologist:  None {   Referring MD: Soundra Pilon, FNP    History of Present Illness:    Jesse Rice is a 65 y.o. male with a hx of HTN and newly diagnosed atrial fibrillation who presents to clinic for follow-up of his Afib.  Patient initially seen on 11/23/20 where he remained in Afib with associated fatigue. We started him on metoprolol and apixaban at that time. TTE pending.  Today, the patient states that he continues to feel tired. No chest pain, SOB, lightheadedness or syncope. Admits to missing a dose of his eliquis this weekend but otherwise has been compliant. Denies any bleeding issues. We discussed the option of waiting 3 weeks for DCCV vs TEE/DCCV and given symptoms, will proceed with TEE/DCCV at this time.    Past Medical History:  Diagnosis Date   Hypertension    Hypertensive pulmonary venous disease (HCC) 05/20/2014   IBS (irritable bowel syndrome)     Past Surgical History:  Procedure Laterality Date   CHOLECYSTECTOMY      Current Medications: Current Meds  Medication Sig   apixaban (ELIQUIS) 5 MG TABS tablet Take 1 tablet (5 mg total) by mouth 2 (two) times daily.   ferrous sulfate 325 (65 FE) MG tablet Take 1 tablet by mouth daily.   lisinopril-hydrochlorothiazide (PRINZIDE,ZESTORETIC) 20-25 MG per tablet Take 1 tablet by mouth daily.    loperamide (IMODIUM) 2 MG capsule Take 2 mg by mouth as needed for diarrhea or loose stools.   metoprolol succinate (TOPROL XL) 25 MG 24 hr tablet Take 1 tablet (25 mg total) by mouth daily.     Allergies:   Patient has no known allergies.   Social History   Socioeconomic History   Marital status: Single    Spouse name: Not on file   Number of children: Not on file   Years of education: Not on file   Highest education level: Not on file   Occupational History   Not on file  Tobacco Use   Smoking status: Former    Packs/day: 0.50    Types: Cigarettes   Smokeless tobacco: Never  Substance and Sexual Activity   Alcohol use: No   Drug use: No   Sexual activity: Not on file  Other Topics Concern   Not on file  Social History Narrative   Not on file   Social Determinants of Health   Financial Resource Strain: Not on file  Food Insecurity: Not on file  Transportation Needs: Not on file  Physical Activity: Not on file  Stress: Not on file  Social Connections: Not on file     Family History: The patient's family history includes Arrhythmia in his mother; COPD in his father; Diabetes in his father; Heart murmur in his father; Hypertension in his father and mother; Rheumatic fever in his father.  ROS:   Review of Systems  Constitutional:  Positive for malaise/fatigue. Negative for chills and fever.  HENT:  Negative for hearing loss and nosebleeds.   Eyes:  Negative for double vision and discharge.  Respiratory:  Negative for cough and shortness of breath.   Cardiovascular:  Negative for chest pain, palpitations, orthopnea, claudication, leg swelling and PND.  Gastrointestinal:  Negative for blood in stool, melena, nausea and vomiting.  Genitourinary:  Negative for flank pain  and hematuria.  Musculoskeletal:  Negative for myalgias and neck pain.  Neurological:  Negative for dizziness, speech change and seizures.  Endo/Heme/Allergies:  Negative for environmental allergies.  Psychiatric/Behavioral:  Negative for depression and suicidal ideas.     EKGs/Labs/Other Studies Reviewed:    The following studies were reviewed today: TTE Oct 06, 2013: Study Conclusions   - Left ventricle: The cavity size was normal. Wall thickness was    increased in a pattern of mild LVH. There was focal basal    hypertrophy. Systolic function was normal. The estimated ejection    fraction was in the range of 60% to 65%.  EKG:   11/23/2020:  Atrial fibrillation, rate-related LBBB, HR 91  Recent Labs: No results found for requested labs within last 8760 hours.  Recent Lipid Panel No results found for: CHOL, TRIG, HDL, CHOLHDL, VLDL, LDLCALC, LDLDIRECT   Risk Assessment/Calculations:    CHA2DS2-VASc Score = 1  This indicates a 0.6% annual risk of stroke. The patient's score is based upon: CHF History: No HTN History: Yes Diabetes History: No Stroke History: No Vascular Disease History: No Age Score: 0 Gender Score: 0           Physical Exam:    VS:  BP 130/82   Pulse 86   Ht 6\' 4"  (1.93 m)   Wt 296 lb 3.2 oz (134.4 kg)   SpO2 98%   BMI 36.05 kg/m     Wt Readings from Last 3 Encounters:  12/15/20 296 lb 3.2 oz (134.4 kg)  11/23/20 298 lb (135.2 kg)  06/05/17 269 lb (122 kg)     GEN: Well nourished, well developed in no acute distress HEENT: Normal NECK: No JVD; No carotid bruits CARDIAC: Irregularly irregular. No murmurs RESPIRATORY:  CTAB, no wheezes ABDOMEN: Soft, non-tender, non-distended MUSCULOSKELETAL:  No edema; No deformity  SKIN: Warm and dry NEUROLOGIC:  Alert and oriented x 3 PSYCHIATRIC:  Normal affect   ASSESSMENT:    1. Atrial fibrillation, unspecified type (HCC)    PLAN:    In order of problems listed above:  #Newly Diagnosed Paroxysmal Afib: CHADs-vasc 1 for HTN. Symptomatic with fatigue and lightheadedness. Currently remains in Afib today with HR 90-100s. Has rate related LBBB as well. Unfortunately has missed a dose of his apixaban and therefore will plan on TEE/DCCV -Plan TEE/DCCV as he has missed his apixaban -Continue apixaban 5mg  BID -Continue metop 25mg  XL daily -TTE on Thursday -Check sleep study -TSH normal   #HTN: Controlled.  -Continue lisinopril-HCTZ 20-25mg  daily -Continue metoprolol 25mg  XL daily as above  [facility] has been requested to perform a transesophageal echocardiogram on Jesse Rice for Afib.  After careful review of history and  examination, the risks and benefits of transesophageal echocardiogram have been explained including risks of esophageal damage, perforation (1:10,000 risk), bleeding, pharyngeal hematoma as well as other potential complications associated with conscious sedation including aspiration, arrhythmia, respiratory failure and death. Alternatives to treatment were discussed, questions were answered. Patient is willing to proceed.    Medication Adjustments/Labs and Tests Ordered: Current medicines are reviewed at length with the patient today.  Concerns regarding medicines are outlined above.  Orders Placed This Encounter  Procedures   EKG 12-Lead   No orders of the defined types were placed in this encounter.   Patient Instructions  Medication Instructions:   Your physician recommends that you continue on your current medications as directed. Please refer to the Current Medication list given to you today.  *If you need  a refill on your cardiac medications before your next appointment, please call your pharmacy*  Testing/Procedures:   You are scheduled for a TEE/Cardioversion/TEE Cardioversion on Wednesday 12/23/20 with Dr. Shari Prows.  Please arrive at the [facility] (Main Entrance A) at [facility]: [address] [place], [place] [address] at 8:30 AM (1 hour prior to procedure unless lab work is needed; if lab work is needed arrive 1.5 hours ahead)  DIET: Nothing to eat or drink after midnight except a sip of water with medications (see medication instructions below)  FYI: For your safety, and to allow [place] to monitor your vital signs accurately during the surgery/procedure we request that   if you have artificial nails, gel coating, SNS etc. Please have those removed prior to your surgery/procedure. Not having the nail coverings /polish removed may result in cancellation or delay of your surgery/procedure.   Medication Instructions: Hold ZESTORETIC   Continue your anticoagulant:  ELIQUIS WITH NO SKIPPED DOSES You will need to continue your anticoagulant after your procedure until you are told by your  Provider that it is safe to stop   Labs: If patient is on Coumadin, patient needs pt/INR, CBC, BMET within 3 days (No pt/INR needed for patients taking Xarelto, Eliquis, Pradaxa) For patients receiving anesthesia for TEE and all Cardioversion patients: BMET, CBC within 1 week  Come to: HOSPITAL ON 12/23/20 AT 8:30 AM--YOU WILL HAVE LABS DONE THEN YOUR TEE/CARDIOVERSION THEREAFTER  (Lab option #3) your lab work will be done at the hospital prior to your procedure - you will need to arrive 1  hours ahead of your procedure  You must have a responsible person to drive you home and stay in the waiting area during your procedure. Failure to do so could result in cancellation.  Bring your insurance cards.  *Special Note: Every effort is made to have your procedure done on time. Occasionally there are emergencies that occur at the hospital that may cause delays. Please be patient if a delay does occur.     Follow-Up:  AS PREVIOUSLY SCHEDULED--ECHO AND FOLLOW-UP APPOINTMENT AS ALREADY SCHEDULED     I,Mathew Stumpf,acting as a scribe for Meriam Sprague, MD.,have documented all relevant documentation on the behalf of Meriam Sprague, MD,as directed by  Meriam Sprague, MD while in the presence of Meriam Sprague, MD.  I, Meriam Sprague, MD, have reviewed all documentation for this visit. The documentation on 12/15/20 for the exam, diagnosis, procedures, and orders are all accurate and complete.   Signed, Meriam Sprague, MD  12/15/2020 2:39 PM    [facility]

## 2020-12-17 ENCOUNTER — Ambulatory Visit (HOSPITAL_COMMUNITY): Payer: BC Managed Care – PPO | Attending: Cardiology

## 2020-12-17 ENCOUNTER — Other Ambulatory Visit: Payer: Self-pay

## 2020-12-17 DIAGNOSIS — I4891 Unspecified atrial fibrillation: Secondary | ICD-10-CM | POA: Diagnosis not present

## 2020-12-17 DIAGNOSIS — I10 Essential (primary) hypertension: Secondary | ICD-10-CM | POA: Insufficient documentation

## 2020-12-17 LAB — ECHOCARDIOGRAM COMPLETE
MV M vel: 4.9 m/s
MV Peak grad: 96 mmHg
S' Lateral: 3.85 cm

## 2020-12-17 MED ORDER — PERFLUTREN LIPID MICROSPHERE
1.0000 mL | INTRAVENOUS | Status: AC | PRN
  Administered 2020-12-17: 1 mL via INTRAVENOUS

## 2020-12-21 ENCOUNTER — Telehealth: Payer: Self-pay | Admitting: *Deleted

## 2020-12-21 DIAGNOSIS — I340 Nonrheumatic mitral (valve) insufficiency: Secondary | ICD-10-CM

## 2020-12-21 DIAGNOSIS — I77819 Aortic ectasia, unspecified site: Secondary | ICD-10-CM

## 2020-12-21 DIAGNOSIS — Z0189 Encounter for other specified special examinations: Secondary | ICD-10-CM

## 2020-12-21 NOTE — Telephone Encounter (Signed)
-----   Message from Meriam Sprague, MD sent at 12/18/2020 11:39 AM EDT ----- His echo looks good and shows normal pumping function. He has mild-to-moderate leakiness of the mitral valve (we will monitor this with echoes every 2 years). He also has mild dilation of the aorta. This just means we need to make sure his blood pressure is well controlled and we will monitor it with yearly imaging with either a CT scan or MRI.

## 2020-12-21 NOTE — Telephone Encounter (Signed)
Pt made aware of echo results and recommendations per Dr. Shari Prows. Pt aware that I will go ahead and place in the system for him to have a CT Angio Chest Aorta done in one year, for follow-up of dilated aorta.  Will go ahead and place in the system for him to have an echo done in 2 years to follow-up on his mild-moderate MR, so that echo scheduler can recall this. Informed the pt that he will receive a call back from our CT Scheduler to go ahead and schedule his CT Angio to be done in one year.  Pt verbalized understanding and agrees with this plan.

## 2020-12-23 ENCOUNTER — Other Ambulatory Visit: Payer: Self-pay

## 2020-12-23 ENCOUNTER — Ambulatory Visit (HOSPITAL_COMMUNITY): Payer: BC Managed Care – PPO | Admitting: Certified Registered Nurse Anesthetist

## 2020-12-23 ENCOUNTER — Ambulatory Visit (HOSPITAL_COMMUNITY)
Admission: RE | Admit: 2020-12-23 | Discharge: 2020-12-23 | Disposition: A | Discharge status: Home / Self Care | Payer: BC Managed Care – PPO | Attending: Cardiology | Admitting: Cardiology

## 2020-12-23 ENCOUNTER — Encounter (HOSPITAL_COMMUNITY)
Admission: RE | Disposition: A | Discharge status: Home / Self Care | Payer: Self-pay | Source: Home / Self Care | Attending: Cardiology

## 2020-12-23 ENCOUNTER — Encounter (HOSPITAL_COMMUNITY): Payer: Self-pay | Admitting: Cardiology

## 2020-12-23 ENCOUNTER — Ambulatory Visit (HOSPITAL_BASED_OUTPATIENT_CLINIC_OR_DEPARTMENT_OTHER): Payer: BC Managed Care – PPO

## 2020-12-23 DIAGNOSIS — I4819 Other persistent atrial fibrillation: Secondary | ICD-10-CM

## 2020-12-23 DIAGNOSIS — I340 Nonrheumatic mitral (valve) insufficiency: Secondary | ICD-10-CM

## 2020-12-23 DIAGNOSIS — I4891 Unspecified atrial fibrillation: Secondary | ICD-10-CM | POA: Insufficient documentation

## 2020-12-23 DIAGNOSIS — I088 Other rheumatic multiple valve diseases: Secondary | ICD-10-CM | POA: Diagnosis not present

## 2020-12-23 DIAGNOSIS — Z87891 Personal history of nicotine dependence: Secondary | ICD-10-CM | POA: Insufficient documentation

## 2020-12-23 DIAGNOSIS — I272 Pulmonary hypertension, unspecified: Secondary | ICD-10-CM | POA: Diagnosis not present

## 2020-12-23 DIAGNOSIS — Z79899 Other long term (current) drug therapy: Secondary | ICD-10-CM | POA: Insufficient documentation

## 2020-12-23 DIAGNOSIS — I10 Essential (primary) hypertension: Secondary | ICD-10-CM | POA: Diagnosis not present

## 2020-12-23 DIAGNOSIS — Z7901 Long term (current) use of anticoagulants: Secondary | ICD-10-CM | POA: Diagnosis not present

## 2020-12-23 DIAGNOSIS — K589 Irritable bowel syndrome without diarrhea: Secondary | ICD-10-CM | POA: Diagnosis not present

## 2020-12-23 HISTORY — PX: TEE WITHOUT CARDIOVERSION: SHX5443

## 2020-12-23 HISTORY — PX: CARDIOVERSION: SHX1299

## 2020-12-23 LAB — POCT I-STAT, CHEM 8
BUN: 15 mg/dL (ref 8–23)
Calcium, Ion: 1.2 mmol/L (ref 1.15–1.40)
Chloride: 106 mmol/L (ref 98–111)
Creatinine, Ser: 1.1 mg/dL (ref 0.61–1.24)
Glucose, Bld: 99 mg/dL (ref 70–99)
HCT: 42 % (ref 39.0–52.0)
Hemoglobin: 14.3 g/dL (ref 13.0–17.0)
Potassium: 4.3 mmol/L (ref 3.5–5.1)
Sodium: 140 mmol/L (ref 135–145)
TCO2: 24 mmol/L (ref 22–32)

## 2020-12-23 LAB — ECHO TEE
MV M vel: 4.3 m/s
MV Peak grad: 73.8 mmHg
Radius: 0.56 cm

## 2020-12-23 SURGERY — ECHOCARDIOGRAM, TRANSESOPHAGEAL
Anesthesia: Monitor Anesthesia Care

## 2020-12-23 MED ORDER — LACTATED RINGERS IV SOLN: INTRAVENOUS | Status: DC | PRN

## 2020-12-23 MED ORDER — SODIUM CHLORIDE 0.9 % IV SOLN: INTRAVENOUS | Status: DC

## 2020-12-23 MED ORDER — PHENYLEPHRINE 40 MCG/ML (10ML) SYRINGE FOR IV PUSH (FOR BLOOD PRESSURE SUPPORT)
PREFILLED_SYRINGE | INTRAVENOUS | Status: DC | PRN
  Administered 2020-12-23: 160 ug via INTRAVENOUS
  Administered 2020-12-23: 120 ug via INTRAVENOUS
  Administered 2020-12-23: 80 ug via INTRAVENOUS

## 2020-12-23 MED ORDER — PROPOFOL 10 MG/ML IV BOLUS
INTRAVENOUS | Status: DC | PRN
  Administered 2020-12-23: 50 mg via INTRAVENOUS
  Administered 2020-12-23: 50 ug/kg/min via INTRAVENOUS
  Administered 2020-12-23: 30 mg via INTRAVENOUS

## 2020-12-23 MED ORDER — EPHEDRINE SULFATE-NACL 50-0.9 MG/10ML-% IV SOSY
PREFILLED_SYRINGE | INTRAVENOUS | Status: DC | PRN
  Administered 2020-12-23 (×2): 10 mg via INTRAVENOUS

## 2020-12-23 MED ORDER — LIDOCAINE HCL (CARDIAC) PF 100 MG/5ML IV SOSY
PREFILLED_SYRINGE | INTRAVENOUS | Status: DC | PRN
  Administered 2020-12-23: 60 mg via INTRAVENOUS

## 2020-12-23 NOTE — Addendum Note (Signed)
Addended by: Loa Socks on: 12/23/2020 04:29 PM   Modules accepted: Orders

## 2020-12-23 NOTE — Interval H&P Note (Signed)
History and Physical Interval Note:  12/23/2020 9:58 AM  Jesse Rice  has presented today for surgery, with the diagnosis of AFIB.  The various methods of treatment have been discussed with the patient and family. After consideration of risks, benefits and other options for treatment, the patient has consented to  Procedure(s): TRANSESOPHAGEAL ECHOCARDIOGRAM (TEE) (N/A) CARDIOVERSION (N/A) as a surgical intervention.  The patient's history has been reviewed, patient examined, no change in status, stable for surgery.  I have reviewed the patient's chart and labs.  Questions were answered to the patient's satisfaction.     Meriam Sprague

## 2020-12-23 NOTE — Anesthesia Postprocedure Evaluation (Signed)
Anesthesia Post Note  Patient: Jesse Rice  Procedure(s) Performed: TRANSESOPHAGEAL ECHOCARDIOGRAM (TEE) CARDIOVERSION     Patient location during evaluation: PACU Anesthesia Type: MAC and General Level of consciousness: sedated Pain management: pain level controlled Vital Signs Assessment: post-procedure vital signs reviewed and stable Respiratory status: spontaneous breathing and respiratory function stable Cardiovascular status: stable Postop Assessment: no apparent nausea or vomiting Anesthetic complications: no   No notable events documented.  Last Vitals:  Vitals:   12/23/20 1038 12/23/20 1048  BP: 115/87 102/76  Pulse:    Resp:    Temp:    SpO2:      Last Pain:  Vitals:   12/23/20 1048  TempSrc:   PainSc: 0-No pain                 Merlinda Frederick

## 2020-12-23 NOTE — Progress Notes (Signed)
  Echocardiogram Echocardiogram Transesophageal has been performed.  Leta Jungling M 12/23/2020, 10:36 AM

## 2020-12-23 NOTE — Procedures (Signed)
Procedure: Electrical Cardioversion Indications:  Atrial Fibrillation  Procedure Details:  Consent: Risks of procedure as well as the alternatives and risks of each were explained to the (patient/caregiver).  Consent for procedure obtained.  Time Out: Verified patient identification, verified procedure, site/side was marked, verified correct patient position, special equipment/implants available, medications/allergies/relevent history reviewed, required imaging and test results available. PERFORMED.  Patient placed on cardiac monitor, pulse oximetry, supplemental oxygen as necessary.  Sedation given:  Propofol 200mg ; lidocaine 60mg  given by CV anesthesia for both TEE/DCCV Pacer pads placed anterior and posterior chest.  Cardioverted 2 time(s).  Cardioversion with synchronized biphasic 200J shock.  Evaluation: Findings: Post procedure EKG shows: NSR Complications: None Patient did tolerate procedure well.  Time Spent Directly with the Patient:    12/23/2020, 10:25 AM

## 2020-12-23 NOTE — Anesthesia Preprocedure Evaluation (Addendum)
Anesthesia Evaluation  Patient identified by MRN, date of birth, ID band Patient awake    Reviewed: Allergy & Precautions, NPO status , Patient's Chart, lab work & pertinent test results  Airway Mallampati: II  TM Distance: >3 FB Neck ROM: Full    Dental  (+) Edentulous Upper, Edentulous Lower   Pulmonary neg pulmonary ROS, former smoker,    Pulmonary exam normal breath sounds clear to auscultation       Cardiovascular hypertension, + Valvular Problems/Murmurs MR  Rhythm:Regular Rate:Normal + Diastolic murmurs  1. Left ventricular ejection fraction, by estimation, is 60 to 65%. The  left ventricle has normal function. The left ventricle has no regional  wall motion abnormalities. Left ventricular diastolic function could not  be evaluated.  2. Right ventricular systolic function is normal. The right ventricular  size is normal.  3. The mitral valve is normal in structure. Mild to moderate mitral valve  regurgitation. No evidence of mitral stenosis.  4. The aortic valve is tricuspid. Aortic valve regurgitation is not  visualized. No aortic stenosis is present.  5. Aortic dilatation noted. There is mild dilatation of the aortic root,  measuring 41 mm.  6. The inferior vena cava is normal in size with greater than 50%  respiratory variability, suggesting right atrial pressure of 3 mmHg.    Neuro/Psych negative neurological ROS  negative psych ROS   GI/Hepatic negative GI ROS, Neg liver ROS,   Endo/Other  obesity  Renal/GU negative Renal ROS  negative genitourinary   Musculoskeletal negative musculoskeletal ROS (+)   Abdominal   Peds negative pediatric ROS (+)  Hematology negative hematology ROS (+)   Anesthesia Other Findings   Reproductive/Obstetrics negative OB ROS                           Anesthesia Physical Anesthesia Plan  ASA: 2  Anesthesia Plan: MAC   Post-op Pain  Management:    Induction: Intravenous  PONV Risk Score and Plan: Propofol infusion, TIVA and Treatment may vary due to age or medical condition  Airway Management Planned: Natural Airway and Nasal Cannula  Additional Equipment: None  Intra-op Plan:   Post-operative Plan:   Informed Consent: I have reviewed the patients History and Physical, chart, labs and discussed the procedure including the risks, benefits and alternatives for the proposed anesthesia with the patient or authorized representative who has indicated his/her understanding and acceptance.     Dental advisory given  Plan Discussed with: CRNA and Anesthesiologist  Anesthesia Plan Comments:        Anesthesia Quick Evaluation

## 2020-12-23 NOTE — Transfer of Care (Signed)
Immediate Anesthesia Transfer of Care Note  Patient: Jesse Rice  Procedure(s) Performed: TRANSESOPHAGEAL ECHOCARDIOGRAM (TEE) CARDIOVERSION  Patient Location: Endoscopy Unit  Anesthesia Type:MAC  Level of Consciousness: awake and alert   Airway & Oxygen Therapy: Patient Spontanous Breathing  Post-op Assessment: Report given to RN and Post -op Vital signs reviewed and stable  Post vital signs: Reviewed and stable  Last Vitals:  Vitals Value Taken Time  BP 111/65 12/23/20 1028  Temp    Pulse 92 12/23/20 1030  Resp 17 12/23/20 1030  SpO2 98 % 12/23/20 1030  Vitals shown include unvalidated device data.  Last Pain:  Vitals:   12/23/20 1028  TempSrc: Oral  PainSc: 0-No pain         Complications: No notable events documented.

## 2020-12-23 NOTE — Procedures (Signed)
     Transesophageal Echocardiogram Note  Jesse Rice [id number] 01-16-1956  Procedure: Transesophageal Echocardiogram Indications: Atrial fibrillation  Procedure Details Consent: Obtained Time Out: Verified patient identification, verified procedure, site/side was marked, verified correct patient position, special equipment/implants available, Radiology Safety Procedures followed,  medications/allergies/relevent history reviewed, required imaging and test results available.  Performed  Medications: Propofol: 200mg   Lidocaine: 60mg  Administered by CV anesthesia  Left Ventrical:  LVEF 60-65%  Mitral Valve: Normal structure. Mild-to-moderate mitral regurgitation  Aortic Valve: Tricuspid. No AI  Tricuspid Valve: Normal structure. Trivial TR  Pulmonic Valve: Normal structure. Trivial PI  Left Atrium/ Left atrial appendage: No LAA thrombus  Atrial septum: No evidence of shunting by color doppler  Aorta: Mild plaquing  DCCV performed right after TEE. Please see separate note.  Complications: No apparent complications Patient did tolerate procedure well.  , MD 12/23/2020, 10:23 AM

## 2020-12-24 ENCOUNTER — Encounter (HOSPITAL_COMMUNITY): Payer: Self-pay | Admitting: Cardiology

## 2021-01-06 ENCOUNTER — Other Ambulatory Visit: Payer: Self-pay | Admitting: *Deleted

## 2021-01-06 DIAGNOSIS — G4733 Obstructive sleep apnea (adult) (pediatric): Secondary | ICD-10-CM

## 2021-01-06 DIAGNOSIS — I4891 Unspecified atrial fibrillation: Secondary | ICD-10-CM

## 2021-01-06 DIAGNOSIS — I10 Essential (primary) hypertension: Secondary | ICD-10-CM

## 2021-01-06 MED ORDER — APIXABAN 5 MG PO TABS
5.0000 mg | ORAL_TABLET | Freq: Two times a day (BID) | ORAL | 2 refills | Status: DC
Start: 2021-01-06 — End: 2021-09-21

## 2021-01-06 NOTE — Telephone Encounter (Signed)
Pt called requesting that we send a 90 day supply of his Eliquis to his mail order, [facility], for the coverage is much cheaper. Confirmed this pharmacy with the pt and sent this in as requested. Pt was very appreciative for all the assistance provided.

## 2021-02-04 ENCOUNTER — Other Ambulatory Visit: Payer: Self-pay | Admitting: Cardiology

## 2021-02-04 DIAGNOSIS — I10 Essential (primary) hypertension: Secondary | ICD-10-CM

## 2021-02-04 DIAGNOSIS — I4891 Unspecified atrial fibrillation: Secondary | ICD-10-CM

## 2021-02-05 NOTE — Telephone Encounter (Signed)
Split Night denied. Home Sleep study approved. [employer] Auth# [id number] Valid dates 02/04/21 to 04/04/21. Patient has been scheduled on 10/14 at 12:00 PM.   Need to make patient aware. Left message for patient to call back.

## 2021-02-09 NOTE — Telephone Encounter (Signed)
Informed patient of upcoming home sleep study and patient understanding was verbalized.  Patient understands her/his HST is scheduled for 03/12/21 at 12.

## 2021-02-25 DIAGNOSIS — I4819 Other persistent atrial fibrillation: Secondary | ICD-10-CM | POA: Insufficient documentation

## 2021-02-25 NOTE — H&P (View-Only) (Signed)
Cardiology Office Note:    Date:  02/26/2021   ID:  Jesse Rice, DOB 03/03/1956, MRN [id number]  PCP:  Brake, Andrew R, FNP   [facility] Providers Cardiologist:  Heather E Pemberton, MD Cardiology APP:  Malissie Musgrave T, PA-C     Referring MD: Brake, Andrew R, FNP   Chief Complaint:  F/u for AFib; s/p DCCV in July 2022    Patient Profile:   Jesse Rice is a 64 y.o. male with:  Persistent atrial fibrillation  S/p DCCV in 7/22 Hypertension  Rate related LBBB Dilated aortic root  Echocardiogram 7/22: 41 mm >> CT pending in 11/2021  Prior CV studies: TEE 12/23/20 EF 60-65, normal RVSF, no LAA clot, mild to mod MR, AV sclerosis  Echocardiogram 12/17/20 EF 60-65, no RWMA, normal RVSF, mild to mod MR, Ao root 41 mm  History of Present Illness: Mr. Beedy was last seen by Dr. Pemberton in 7/22.  He remained in AF at that time and underwent a TEE guided DCCV on 12/23/20 with restoration of NSR.    He returns for f/u.  He is here alone.  He has felt well since his DCCV.  He has not had recurrent shortness of breath or fatigue.  He has not had chest pain, syncope, orthopnea, leg edema.      Past Medical History:  Diagnosis Date   Atrial fibrillation (HCC)    s/p DCCV in 11/2020   Dilated aortic root (HCC)    Echo 7/22: 41 mm   Hypertension    Hypertensive pulmonary venous disease (HCC) 05/20/2014   IBS (irritable bowel syndrome)    LBBB (left bundle branch block)    rate related   Current Medications: Current Meds  Medication Sig   apixaban (ELIQUIS) 5 MG TABS tablet Take 1 tablet (5 mg total) by mouth 2 (two) times daily.   ferrous sulfate 325 (65 FE) MG tablet Take 1 tablet by mouth daily.   lisinopril-hydrochlorothiazide (PRINZIDE,ZESTORETIC) 20-25 MG per tablet Take 1 tablet by mouth daily.    loperamide (IMODIUM) 2 MG capsule Take 2 mg by mouth as needed for diarrhea or loose stools.   [DISCONTINUED] metoprolol succinate (TOPROL XL) 25 MG 24 hr tablet Take 1  tablet (25 mg total) by mouth daily.    Allergies:   Patient has no known allergies.   Social History   Tobacco Use   Smoking status: Former    Packs/day: 0.50    Types: Cigarettes   Smokeless tobacco: Never  Substance Use Topics   Alcohol use: No   Drug use: No    Family Hx: The patient's family history includes Arrhythmia in his mother; COPD in his father; Diabetes in his father; Heart murmur in his father; Hypertension in his father and mother; Rheumatic fever in his father.  Review of Systems  Gastrointestinal:  Negative for hematochezia and melena.  Genitourinary:  Negative for hematuria.    EKGs/Labs/Other Test Reviewed:    EKG:  EKG is   ordered today.  The ekg ordered today demonstrates atrial fibrillation, HR 102, left bundle branch block, QTC 474  Recent Labs: 12/23/2020: BUN 15; Creatinine, Ser 1.10; Hemoglobin 14.3; Potassium 4.3; Sodium 140   Recent Lipid Panel No results found for: CHOL, TRIG, HDL, LDLCALC, LDLDIRECT   Risk Assessment/Calculations:    CHA2DS2-VASc Score = 1   This indicates a 0.6% annual risk of stroke. The patient's score is based upon: CHF History: 0 HTN History: 1 Diabetes History: 0 Stroke History:   0 Vascular Disease History: 0 Age Score: 0 Gender Score: 0          Physical Exam:    VS:  BP (!) 126/46   Pulse (!) 102   Ht 6\' 4"  (1.93 m)   Wt 283 lb 6.4 oz (128.5 kg)   SpO2 98%   BMI 34.50 kg/m     Wt Readings from Last 3 Encounters:  02/26/21 283 lb 6.4 oz (128.5 kg)  12/23/20 286 lb (129.7 kg)  12/15/20 296 lb 3.2 oz (134.4 kg)    Constitutional:      Appearance: Healthy appearance. Not in distress.  Neck:     Vascular: JVD normal.  Pulmonary:     Effort: Pulmonary effort is normal.     Breath sounds: No wheezing. No rales.  Cardiovascular:     Tachycardia present. Irregularly irregular rhythm. Normal S1. Normal S2.      Murmurs: There is no murmur.  Edema:    Peripheral edema absent.  Abdominal:      Palpations: Abdomen is soft.  Skin:    General: Skin is warm and dry.  Neurological:     General: No focal deficit present.     Mental Status: Alert and oriented to person, place and time.     Cranial Nerves: Cranial nerves are intact.     ASSESSMENT & PLAN:   1. Persistent atrial fibrillation (HCC) He is back in AF with RVR.  He has a rate related LBBB.  He is currently asymptomatic.  He was symptomatic prior to his last DCCV.  We discussed antiarrhythmic drug Rx as well as PVI ablation as management strategies for atrial fibrillation.  Although he is not symptomatic today, I suspect he will become symptomatic at some point.  I reviewed management with Dr. 12/17/20 (attending MD).  We are hesitant to try Flecainide, Amiodarone or Dofetilide given his LBBB.  Therefore, we think it is best to refer him to EP for consideration of PVI ablation.  In the short term, I will increase his beta-blocker and set up another DCCV to keep his rhythm as controlled as possible.  He has been on Apixaban without interruption since his DCCV.  CHA2DS2-VASc Score = 1 [CHF History: 0, HTN History: 1, Diabetes History: 0, Stroke History: 0, Vascular Disease History: 0, Age Score: 0, Gender Score: 0].  Therefore, the patient's annual risk of stroke is 0.6 %.  He would likely be able to come off of anticoagulation at some point once we can get his rhythm controlled.    -BMET, CBC  -DCCV next week  -Increase Metoprolol succinate to 50 mg once daily   -Refer to EP  -We will see back in gen Card once he is released by EP  2. Essential hypertension, benign BP is well controlled.  Continue lisinopril/HCTZ 20/25 mg daily.  Increase metoprolol succinate to 50 mg daily as noted above.  If his blood pressure runs too low, we can decrease his lisinopril/HCTZ to 10/12.5 mg daily.  3. Dilation of aorta [facility]) He has a CT scan pending next year.  4. Moderate mitral regurgitation Consider follow-up echocardiogram in 1 to 2  years.  5. OSA (obstructive sleep apnea) He has a sleep study pending next month.    Shared Decision Making/Informed Consent The risks (stroke, cardiac arrhythmias rarely resulting in the need for a temporary or permanent pacemaker, skin irritation or burns and complications associated with conscious sedation including aspiration, arrhythmia, respiratory failure and death), benefits (restoration of normal  sinus rhythm) and alternatives of a direct current cardioversion were explained in detail to Mr. Kolarik and he agrees to proceed.     Dispo:  Return for evaluation with EP for atrial fibrillation, for Post Cardioversion.   Medication Adjustments/Labs and Tests Ordered: Current medicines are reviewed at length with the patient today.  Concerns regarding medicines are outlined above.  Tests Ordered: Orders Placed This Encounter  Procedures   Basic Metabolic Panel (BMET)   CBC   Ambulatory referral to Cardiac Electrophysiology   Ambulatory referral to Cardiac Electrophysiology   EKG 12-Lead   Medication Changes: Meds ordered this encounter  Medications   metoprolol succinate (TOPROL XL) 25 MG 24 hr tablet    Sig: Take 2 tablets (50 mg total) by mouth daily.    Dispense:  90 tablet    Refill:  1   Signed, Virgilene Stryker, PA-C  02/26/2021 1:01 PM     [facility] [address], [place], [place]  [address] Phone: [phone]; Fax: [phone]     

## 2021-02-25 NOTE — Progress Notes (Signed)
Cardiology Office Note:    Date:  02/26/2021   ID:  Jesse Rice, DOB 03/08/1956, MRN [id number]  PCP:  Soundra Pilon, FNP   [facility] Providers Cardiologist:  Meriam Sprague, MD Cardiology APP:  Kennon Rounds     Referring MD: Soundra Pilon, FNP   Chief Complaint:  F/u for AFib; s/p DCCV in July 2022    Patient Profile:   Jesse Rice is a 65 y.o. male with:  Persistent atrial fibrillation  S/p DCCV in 7/22 Hypertension  Rate related LBBB Dilated aortic root  Echocardiogram 7/22: 41 mm >> CT pending in 11/2021  Prior CV studies: TEE 12/23/20 EF 60-65, normal RVSF, no LAA clot, mild to mod MR, AV sclerosis  Echocardiogram 12/17/20 EF 60-65, no RWMA, normal RVSF, mild to mod MR, Ao root 41 mm  History of Present Illness: Jesse Rice was last seen by Dr. Shari Prows in 7/22.  He remained in AF at that time and underwent a TEE guided DCCV on 12/23/20 with restoration of NSR.    He returns for f/u.  He is here alone.  He has felt well since his DCCV.  He has not had recurrent shortness of breath or fatigue.  He has not had chest pain, syncope, orthopnea, leg edema.      Past Medical History:  Diagnosis Date   Atrial fibrillation [facility])    s/p DCCV in 11/2020   Dilated aortic root (HCC)    Echo 7/22: 41 mm   Hypertension    Hypertensive pulmonary venous disease (HCC) 05/20/2014   IBS (irritable bowel syndrome)    LBBB (left bundle branch block)    rate related   Current Medications: Current Meds  Medication Sig   apixaban (ELIQUIS) 5 MG TABS tablet Take 1 tablet (5 mg total) by mouth 2 (two) times daily.   ferrous sulfate 325 (65 FE) MG tablet Take 1 tablet by mouth daily.   lisinopril-hydrochlorothiazide (PRINZIDE,ZESTORETIC) 20-25 MG per tablet Take 1 tablet by mouth daily.    loperamide (IMODIUM) 2 MG capsule Take 2 mg by mouth as needed for diarrhea or loose stools.   [DISCONTINUED] metoprolol succinate (TOPROL XL) 25 MG 24 hr tablet Take 1  tablet (25 mg total) by mouth daily.    Allergies:   Patient has no known allergies.   Social History   Tobacco Use   Smoking status: Former    Packs/day: 0.50    Types: Cigarettes   Smokeless tobacco: Never  Substance Use Topics   Alcohol use: No   Drug use: No    Family Hx: The patient's family history includes Arrhythmia in his mother; COPD in his father; Diabetes in his father; Heart murmur in his father; Hypertension in his father and mother; Rheumatic fever in his father.  Review of Systems  Gastrointestinal:  Negative for hematochezia and melena.  Genitourinary:  Negative for hematuria.    EKGs/Labs/Other Test Reviewed:    EKG:  EKG is   ordered today.  The ekg ordered today demonstrates atrial fibrillation, HR 102, left bundle branch block, QTC 474  Recent Labs: 12/23/2020: BUN 15; Creatinine, Ser 1.10; Hemoglobin 14.3; Potassium 4.3; Sodium 140   Recent Lipid Panel No results found for: CHOL, TRIG, HDL, LDLCALC, LDLDIRECT   Risk Assessment/Calculations:    CHA2DS2-VASc Score = 1   This indicates a 0.6% annual risk of stroke. The patient's score is based upon: CHF History: 0 HTN History: 1 Diabetes History: 0 Stroke History:  0 Vascular Disease History: 0 Age Score: 0 Gender Score: 0          Physical Exam:    VS:  BP (!) 126/46   Pulse (!) 102   Ht 6\' 4"  (1.93 m)   Wt 283 lb 6.4 oz (128.5 kg)   SpO2 98%   BMI 34.50 kg/m     Wt Readings from Last 3 Encounters:  02/26/21 283 lb 6.4 oz (128.5 kg)  12/23/20 286 lb (129.7 kg)  12/15/20 296 lb 3.2 oz (134.4 kg)    Constitutional:      Appearance: Healthy appearance. Not in distress.  Neck:     Vascular: JVD normal.  Pulmonary:     Effort: Pulmonary effort is normal.     Breath sounds: No wheezing. No rales.  Cardiovascular:     Tachycardia present. Irregularly irregular rhythm. Normal S1. Normal S2.      Murmurs: There is no murmur.  Edema:    Peripheral edema absent.  Abdominal:      Palpations: Abdomen is soft.  Skin:    General: Skin is warm and dry.  Neurological:     General: No focal deficit present.     Mental Status: Alert and oriented to person, place and time.     Cranial Nerves: Cranial nerves are intact.     ASSESSMENT & PLAN:   1. Persistent atrial fibrillation (HCC) He is back in AF with RVR.  He has a rate related LBBB.  He is currently asymptomatic.  He was symptomatic prior to his last DCCV.  We discussed antiarrhythmic drug Rx as well as PVI ablation as management strategies for atrial fibrillation.  Although he is not symptomatic today, I suspect he will become symptomatic at some point.  I reviewed management with Dr. 12/17/20 (attending MD).  We are hesitant to try Flecainide, Amiodarone or Dofetilide given his LBBB.  Therefore, we think it is best to refer him to EP for consideration of PVI ablation.  In the short term, I will increase his beta-blocker and set up another DCCV to keep his rhythm as controlled as possible.  He has been on Apixaban without interruption since his DCCV.  CHA2DS2-VASc Score = 1 [CHF History: 0, HTN History: 1, Diabetes History: 0, Stroke History: 0, Vascular Disease History: 0, Age Score: 0, Gender Score: 0].  Therefore, the patient's annual risk of stroke is 0.6 %.  He would likely be able to come off of anticoagulation at some point once we can get his rhythm controlled.    -BMET, CBC  -DCCV next week  -Increase Metoprolol succinate to 50 mg once daily   -Refer to EP  -We will see back in gen Card once he is released by EP  2. Essential hypertension, benign BP is well controlled.  Continue lisinopril/HCTZ 20/25 mg daily.  Increase metoprolol succinate to 50 mg daily as noted above.  If his blood pressure runs too low, we can decrease his lisinopril/HCTZ to 10/12.5 mg daily.  3. Dilation of aorta [facility]) He has a CT scan pending next year.  4. Moderate mitral regurgitation Consider follow-up echocardiogram in 1 to 2  years.  5. OSA (obstructive sleep apnea) He has a sleep study pending next month.    Shared Decision Making/Informed Consent The risks (stroke, cardiac arrhythmias rarely resulting in the need for a temporary or permanent pacemaker, skin irritation or burns and complications associated with conscious sedation including aspiration, arrhythmia, respiratory failure and death), benefits (restoration of normal  sinus rhythm) and alternatives of a direct current cardioversion were explained in detail to Jesse Rice and he agrees to proceed.     Dispo:  Return for evaluation with EP for atrial fibrillation, for Post Cardioversion.   Medication Adjustments/Labs and Tests Ordered: Current medicines are reviewed at length with the patient today.  Concerns regarding medicines are outlined above.  Tests Ordered: Orders Placed This Encounter  Procedures   Basic Metabolic Panel (BMET)   CBC   Ambulatory referral to Cardiac Electrophysiology   Ambulatory referral to Cardiac Electrophysiology   EKG 12-Lead   Medication Changes: Meds ordered this encounter  Medications   metoprolol succinate (TOPROL XL) 25 MG 24 hr tablet    Sig: Take 2 tablets (50 mg total) by mouth daily.    Dispense:  90 tablet    Refill:  1   Signed, Tereso Newcomer, PA-C  02/26/2021 1:01 PM    [facility] [address] [place], [place], [place]  [address] Phone: [phone]; Fax: [phone]

## 2021-02-26 ENCOUNTER — Ambulatory Visit (INDEPENDENT_AMBULATORY_CARE_PROVIDER_SITE_OTHER): Payer: BC Managed Care – PPO | Admitting: Physician Assistant

## 2021-02-26 ENCOUNTER — Other Ambulatory Visit: Payer: Self-pay

## 2021-02-26 ENCOUNTER — Encounter: Payer: Self-pay | Admitting: Physician Assistant

## 2021-02-26 VITALS — BP 126/46 | HR 102 | Ht 76.0 in | Wt 283.4 lb

## 2021-02-26 DIAGNOSIS — G4733 Obstructive sleep apnea (adult) (pediatric): Secondary | ICD-10-CM

## 2021-02-26 DIAGNOSIS — N179 Acute kidney failure, unspecified: Secondary | ICD-10-CM

## 2021-02-26 DIAGNOSIS — I340 Nonrheumatic mitral (valve) insufficiency: Secondary | ICD-10-CM | POA: Diagnosis not present

## 2021-02-26 DIAGNOSIS — I4819 Other persistent atrial fibrillation: Secondary | ICD-10-CM | POA: Diagnosis not present

## 2021-02-26 DIAGNOSIS — I77819 Aortic ectasia, unspecified site: Secondary | ICD-10-CM | POA: Diagnosis not present

## 2021-02-26 DIAGNOSIS — I10 Essential (primary) hypertension: Secondary | ICD-10-CM

## 2021-02-26 MED ORDER — METOPROLOL SUCCINATE ER 25 MG PO TB24: 50.0000 mg | ORAL_TABLET | Freq: Every day | ORAL | 1 refills | Status: DC

## 2021-02-26 NOTE — Patient Instructions (Addendum)
Medication Instructions:   INCREASE TOPROL two tablets by mouth ( 50 mg) daily.  *If you need a refill on your cardiac medications before your next appointment, please call your pharmacy*   Lab Work: TODAY!!!!! BMET/CBC  If you have labs (blood work) drawn today and your tests are completely normal, you will receive your results only by: MyChart Message (if you have MyChart) OR A paper copy in the mail If you have any lab test that is abnormal or we need to change your treatment, we will call you to review the results.    Testing/Procedures: Your provider has recommended a cardioversion.   You are scheduled for a cardioversion on  at  with Dr.  or associates. Please go to [facility] ([address]) 2nd Floor Short Stay at .  There is free valet parking available.  Enter through the [employer] Do not have any food or drink after midnight on .  You may take your medicines with a sip of water on the day of your procedure.   DO NOT STOP YOUR ANTICOAGULANT (BLOOD THINNER) Eliquis - YOU WILL NEED TO CONTINUE YOUR ANTICOAGULANT AFTER YOUR PROCEDURE UNTIL YOU ARE TOLD BY YOUR PROVIDER IT IS SAFE TO STOP  You will need someone to drive you home following your procedure and stay in the waiting room during your procedure. Failure to do so could result in your procedure being cancelled.   Every effort is made to have your procedure done on time. Occasionally there are emergencies that occur at the hospital that may cause delays.   Call the [facility] office at [phone] if you have any questions, problems or concerns.     Electrical Cardioversion Electrical cardioversion is the delivery of a jolt of electricity to change the rhythm of the heart. Sticky patches or metal paddles are placed on the chest to deliver the electricity from a device. This is done to restore a normal rhythm. A rhythm that is too fast or not regular keeps the heart  from pumping well. Electrical cardioversion is done in an emergency if:  There is low or no blood pressure as a result of the heart rhythm.   Normal rhythm must be restored as fast as possible to protect the brain and heart from further damage.   It may save a life. Cardioversion may be done for heart rhythms that are not immediately life threatening, such as atrial fibrillation or flutter, in which:  The heart is beating too fast or is not regular.   Medicine to change the rhythm has not worked.   It is safe to wait in order to allow time for preparation. Symptoms of the abnormal rhythm are bothersome. The risk of stroke and other serious problems can be reduced.  LET [facility] CARE PROVIDER KNOW ABOUT:  Any allergies you have. All medicines you are taking, including vitamins, herbs, eye drops, creams, and over-the-counter medicines. Previous problems you or members of your family have had with the use of anesthetics.   Any blood disorders you have.   Previous surgeries you have had.   Medical conditions you have.   RISKS AND COMPLICATIONS  Generally, this is a safe procedure. However, problems can occur and include:  Breathing problems related to the anesthetic used. A blood clot that breaks free and travels to other parts of your body. This could cause a stroke or other problems. The risk of this is lowered by use  of blood-thinning medicine (anticoagulant) prior to the procedure. Cardiac arrest (rare).   BEFORE THE PROCEDURE  You may have tests to detect blood clots in your heart and to evaluate heart function.  You may start taking anticoagulants so your blood does not clot as easily.   Medicines may be given to help stabilize your heart rate and rhythm.   PROCEDURE You will be given medicine through an IV tube to reduce discomfort and make you sleepy (sedative).   An electrical shock will be delivered.   AFTER THE PROCEDURE Your heart rhythm will be watched to make sure  it does not change. You will need someone to drive you home.     Follow-Up: At [facility], you and your health needs are our priority.  As part of our continuing mission to provide you with exceptional heart care, we have created designated Provider Care Teams.  These Care Teams include your primary Cardiologist (physician) and Advanced Practice Providers (APPs -  Physician Assistants and Nurse Practitioners) who all work together to provide you with the care you need, when you need it.  We recommend signing up for the patient portal called "MyChart".  Sign up information is provided on this After Visit Summary.  MyChart is used to connect with patients for Virtual Visits (Telemedicine).  Patients are able to view lab/test results, encounter notes, upcoming appointments, etc.  Non-urgent messages can be sent to your provider as well.   To learn more about what you can do with MyChart, go to [email or web address].    Your next appointment:   You have been referred to Dr. Lalla Brothers for post cardioversion.    The format for your next appointment:   In Person  Provider:      Other Instructions  Call the office on Monday around 7-7:30 [phone] and tell me dates of cardioversion. Jesse Rice).

## 2021-02-27 LAB — BASIC METABOLIC PANEL
BUN/Creatinine Ratio: 8 — ABNORMAL LOW (ref 10–24)
BUN: 14 mg/dL (ref 8–27)
CO2: 23 mmol/L (ref 20–29)
Calcium: 9.5 mg/dL (ref 8.6–10.2)
Chloride: 104 mmol/L (ref 96–106)
Creatinine, Ser: 1.78 mg/dL — ABNORMAL HIGH (ref 0.76–1.27)
Glucose: 68 mg/dL — ABNORMAL LOW (ref 70–99)
Potassium: 5.1 mmol/L (ref 3.5–5.2)
Sodium: 145 mmol/L — ABNORMAL HIGH (ref 134–144)
eGFR: 43 mL/min/{1.73_m2} — ABNORMAL LOW (ref >59)

## 2021-02-27 LAB — CBC
Hematocrit: 40.7 % (ref 37.5–51.0)
Hemoglobin: 13.5 g/dL (ref 13.0–17.7)
MCH: 30.6 pg (ref 26.6–33.0)
MCHC: 33.2 g/dL (ref 31.5–35.7)
MCV: 92 fL (ref 79–97)
Platelets: 253 10*3/uL (ref 150–450)
RBC: 4.41 x10E6/uL (ref 4.14–5.80)
RDW: 12.1 % (ref 11.6–15.4)
WBC: 9.7 10*3/uL (ref 3.4–10.8)

## 2021-03-01 ENCOUNTER — Telehealth: Payer: Self-pay | Admitting: *Deleted

## 2021-03-01 ENCOUNTER — Other Ambulatory Visit: Payer: Self-pay | Admitting: *Deleted

## 2021-03-01 NOTE — Telephone Encounter (Signed)
Pt calling in today states he is only taking lisinopril one tablet by mouth ( 20 mg) daily, no HCTZ.  Stated Dr. Shari Prows stopped this medication in June. I do not see where this medication has been stopped. Will call pt back in the am to advise with Scott and when pt checks all pt's medication bottles.

## 2021-03-01 NOTE — Telephone Encounter (Signed)
Pt calling in this am to give dates for DCCV.  Can do DCCV anytime.

## 2021-03-01 NOTE — Telephone Encounter (Signed)
S/w pt is scheduled for DCCV on Thursday, October 6 @ 7:30 with Dr. Servando Salina # [phone].  Will send to [place] to [place].

## 2021-03-02 ENCOUNTER — Other Ambulatory Visit: Payer: Self-pay | Admitting: *Deleted

## 2021-03-02 MED ORDER — ATORVASTATIN CALCIUM 10 MG PO TABS: 10.0000 mg | ORAL_TABLET | Freq: Every day | ORAL | 3 refills | Status: AC

## 2021-03-02 NOTE — Telephone Encounter (Signed)
S/w pt went over all medications, updated medication list. D/C lisinopril.  Pt was not taking HCTZ. Pt is going to hospital on day of cardioversion for repeat bmet and will come in hour hour earlier than scheduled time for cardioversion.  Lorin Picket is aware to add bmet to cardioversion orders. Pt's bp last night 119/84 HR 80. Pt took lisinopril yesterday.  Pt was advised to keep bp diary and bring with pt to Dr. Lalla Brothers appointment.  Will talk with Coralee North, sleep tech, pt has sleep appt same day as Dr. Lalla Brothers appt.  Pt Had multiple questions.  All answered.

## 2021-03-02 NOTE — Telephone Encounter (Signed)
Hold Lisinopril Will get BMET morning of DCCV as planned. Tereso Newcomer, PA-C    03/02/2021 7:50 AM

## 2021-03-03 NOTE — Addendum Note (Signed)
Addended byAlben Spittle, Lorin Picket T on: 03/03/2021 05:01 PM   Modules accepted: Orders, SmartSet

## 2021-03-03 NOTE — Addendum Note (Signed)
Addended byAlben Spittle, Lorin Picket T on: 03/03/2021 05:32 PM   Modules accepted: Orders, SmartSet

## 2021-03-04 ENCOUNTER — Encounter (HOSPITAL_COMMUNITY)
Admission: RE | Disposition: A | Discharge status: Home / Self Care | Payer: Self-pay | Source: Home / Self Care | Attending: Cardiology

## 2021-03-04 ENCOUNTER — Encounter (HOSPITAL_COMMUNITY): Payer: Self-pay | Admitting: Cardiology

## 2021-03-04 ENCOUNTER — Ambulatory Visit (HOSPITAL_COMMUNITY)
Admission: RE | Admit: 2021-03-04 | Discharge: 2021-03-04 | Disposition: A | Discharge status: Home / Self Care | Payer: BC Managed Care – PPO | Attending: Cardiology | Admitting: Cardiology

## 2021-03-04 ENCOUNTER — Ambulatory Visit (HOSPITAL_COMMUNITY): Payer: BC Managed Care – PPO | Admitting: Anesthesiology

## 2021-03-04 DIAGNOSIS — I10 Essential (primary) hypertension: Secondary | ICD-10-CM | POA: Insufficient documentation

## 2021-03-04 DIAGNOSIS — I491 Atrial premature depolarization: Secondary | ICD-10-CM | POA: Insufficient documentation

## 2021-03-04 DIAGNOSIS — I340 Nonrheumatic mitral (valve) insufficiency: Secondary | ICD-10-CM | POA: Insufficient documentation

## 2021-03-04 DIAGNOSIS — Z8249 Family history of ischemic heart disease and other diseases of the circulatory system: Secondary | ICD-10-CM | POA: Diagnosis not present

## 2021-03-04 DIAGNOSIS — Z87891 Personal history of nicotine dependence: Secondary | ICD-10-CM | POA: Diagnosis not present

## 2021-03-04 DIAGNOSIS — Z79899 Other long term (current) drug therapy: Secondary | ICD-10-CM | POA: Insufficient documentation

## 2021-03-04 DIAGNOSIS — K589 Irritable bowel syndrome without diarrhea: Secondary | ICD-10-CM | POA: Diagnosis not present

## 2021-03-04 DIAGNOSIS — I4819 Other persistent atrial fibrillation: Secondary | ICD-10-CM | POA: Insufficient documentation

## 2021-03-04 DIAGNOSIS — I447 Left bundle-branch block, unspecified: Secondary | ICD-10-CM | POA: Diagnosis not present

## 2021-03-04 DIAGNOSIS — G4733 Obstructive sleep apnea (adult) (pediatric): Secondary | ICD-10-CM | POA: Diagnosis not present

## 2021-03-04 DIAGNOSIS — Z7901 Long term (current) use of anticoagulants: Secondary | ICD-10-CM | POA: Insufficient documentation

## 2021-03-04 DIAGNOSIS — I4891 Unspecified atrial fibrillation: Secondary | ICD-10-CM | POA: Diagnosis not present

## 2021-03-04 DIAGNOSIS — N179 Acute kidney failure, unspecified: Secondary | ICD-10-CM

## 2021-03-04 HISTORY — PX: CARDIOVERSION: SHX1299

## 2021-03-04 LAB — POCT I-STAT, CHEM 8
BUN: 15 mg/dL (ref 8–23)
Calcium, Ion: 1.28 mmol/L (ref 1.15–1.40)
Chloride: 103 mmol/L (ref 98–111)
Creatinine, Ser: 1.2 mg/dL (ref 0.61–1.24)
Glucose, Bld: 98 mg/dL (ref 70–99)
HCT: 40 % (ref 39.0–52.0)
Hemoglobin: 13.6 g/dL (ref 13.0–17.0)
Potassium: 4.4 mmol/L (ref 3.5–5.1)
Sodium: 140 mmol/L (ref 135–145)
TCO2: 24 mmol/L (ref 22–32)

## 2021-03-04 SURGERY — CARDIOVERSION
Anesthesia: General

## 2021-03-04 MED ORDER — HYDROCORTISONE 1 % EX CREA: 1.0000 "application " | TOPICAL_CREAM | Freq: Three times a day (TID) | CUTANEOUS | Status: DC | PRN

## 2021-03-04 MED ORDER — LACTATED RINGERS IV SOLN
INTRAVENOUS | Status: DC | PRN
Start: 2021-03-04 — End: 2021-03-04

## 2021-03-04 MED ORDER — LIDOCAINE 2% (20 MG/ML) 5 ML SYRINGE
INTRAMUSCULAR | Status: DC | PRN
  Administered 2021-03-04: 60 mg via INTRAVENOUS

## 2021-03-04 MED ORDER — PROPOFOL 500 MG/50ML IV EMUL
INTRAVENOUS | Status: DC | PRN
  Administered 2021-03-04: 50 mg via INTRAVENOUS

## 2021-03-04 NOTE — CV Procedure (Signed)
  Procedure:   DCCV  Indication:  Symptomatic atrial fibrillation  Procedure Note:  The patient signed informed consent.  He has had had therapeutic anticoagulation with Eliquis greater than 3 weeks.  Anesthesia was administered by Dr. Bradley Ferris.  Adequate airway was maintained throughout and vital followed per protocol.  He was cardioverted x 1 with 200J of biphasic synchronized energy.  He converted to sinus bradycardia  There were no apparent complications.  The patient had normal neuro status and respiratory status post procedure with vitals stable as recorded elsewhere.    Follow up:  We will arrange follow up with his primary cardiologist.  He will continue on current medical therapy.  I have advised the patient that he should continue his Eliquis without interruption for the next 4 week. He expresses understanding.  Jesse Ripple, DO, [facility]

## 2021-03-04 NOTE — Transfer of Care (Signed)
Immediate Anesthesia Transfer of Care Note  Patient: Jesse Rice  Procedure(s) Performed: CARDIOVERSION  Patient Location: Endoscopy Unit  Anesthesia Type:General  Level of Consciousness: awake, alert  and oriented  Airway & Oxygen Therapy: Patient Spontanous Breathing and Patient connected to face mask oxygen  Post-op Assessment: Report given to RN and Post -op Vital signs reviewed and stable  Post vital signs: Reviewed and stable  Last Vitals:  Vitals Value Taken Time  BP 126/87   Temp    Pulse 73   Resp 12   SpO2 100     Last Pain:  Vitals:   03/04/21 0700  TempSrc: Temporal  PainSc: 0-No pain         Complications: No notable events documented.

## 2021-03-04 NOTE — Interval H&P Note (Signed)
History and Physical Interval Note:  03/04/2021 8:09 AM  Jesse Rice  has presented today for surgery, with the diagnosis of AFIB.  The various methods of treatment have been discussed with the patient and family. After consideration of risks, benefits and other options for treatment, the patient has consented to  Procedure(s): CARDIOVERSION (N/A) as a surgical intervention.  The patient's history has been reviewed, patient examined, no change in status, stable for surgery.  I have reviewed the patient's chart and labs.  Questions were answered to the patient's satisfaction.  He has given consent we will proceed with his cardioversion.   Ulysses Alper

## 2021-03-04 NOTE — Anesthesia Postprocedure Evaluation (Signed)
Anesthesia Post Note  Patient: Jesse Rice  Procedure(s) Performed: CARDIOVERSION     Patient location during evaluation: Endoscopy Anesthesia Type: General Level of consciousness: awake Pain management: pain level controlled Vital Signs Assessment: post-procedure vital signs reviewed and stable Respiratory status: spontaneous breathing, nonlabored ventilation, respiratory function stable and patient connected to nasal cannula oxygen Cardiovascular status: blood pressure returned to baseline and stable Postop Assessment: no apparent nausea or vomiting Anesthetic complications: no   No notable events documented.  Last Vitals:  Vitals:   03/04/21 0840 03/04/21 0850  BP: 117/81 (!) 129/97  Pulse: 60 (!) 58  Resp: 13 12  Temp:    SpO2: 99% 99%    Last Pain:  Vitals:   03/04/21 0850  TempSrc:   PainSc: 0-No pain                 Alazar Cherian P Althia Egolf

## 2021-03-04 NOTE — Discharge Instructions (Signed)

## 2021-03-04 NOTE — Anesthesia Procedure Notes (Signed)
Procedure Name: General with mask airway Date/Time: 03/04/2021 8:19 AM Performed by: Lelon Perla, CRNA Pre-anesthesia Checklist: Patient identified and Emergency Drugs available Patient Re-evaluated:Patient Re-evaluated prior to induction Oxygen Delivery Method: Simple face mask Preoxygenation: Pre-oxygenation with 100% oxygen Induction Type: IV induction Placement Confirmation: positive ETCO2 and CO2 detector

## 2021-03-04 NOTE — Anesthesia Preprocedure Evaluation (Signed)
Anesthesia Evaluation  Patient identified by MRN, date of birth, ID band Patient awake    Reviewed: Allergy & Precautions, NPO status , Patient's Chart, lab work & pertinent test results  Airway Mallampati: III  TM Distance: >3 FB Neck ROM: Full    Dental  (+) Lower Dentures, Upper Dentures   Pulmonary former smoker,    Pulmonary exam normal breath sounds clear to auscultation       Cardiovascular hypertension, Pt. on home beta blockers pulmonary hypertension Rhythm:Irregular Rate:Normal     Neuro/Psych negative neurological ROS  negative psych ROS   GI/Hepatic Neg liver ROS, IBS   Endo/Other  negative endocrine ROS  Renal/GU Renal disease     Musculoskeletal negative musculoskeletal ROS (+)   Abdominal (+) + obese,   Peds  Hematology HLD   Anesthesia Other Findings A-FIB  Reproductive/Obstetrics                             Anesthesia Physical Anesthesia Plan  ASA: 3  Anesthesia Plan: General   Post-op Pain Management:    Induction: Intravenous  PONV Risk Score and Plan: 2 and Propofol infusion and Treatment may vary due to age or medical condition  Airway Management Planned: Simple Face Mask  Additional Equipment:   Intra-op Plan:   Post-operative Plan:   Informed Consent: I have reviewed the patients History and Physical, chart, labs and discussed the procedure including the risks, benefits and alternatives for the proposed anesthesia with the patient or authorized representative who has indicated his/her understanding and acceptance.     Dental advisory given  Plan Discussed with: CRNA  Anesthesia Plan Comments:         Anesthesia Quick Evaluation

## 2021-03-05 ENCOUNTER — Encounter (HOSPITAL_COMMUNITY): Payer: Self-pay | Admitting: Cardiology

## 2021-03-08 ENCOUNTER — Other Ambulatory Visit: Payer: Self-pay | Admitting: *Deleted

## 2021-03-08 DIAGNOSIS — G4733 Obstructive sleep apnea (adult) (pediatric): Secondary | ICD-10-CM

## 2021-03-08 MED ORDER — METOPROLOL SUCCINATE ER 50 MG PO TB24: 50.0000 mg | ORAL_TABLET | Freq: Every day | ORAL | 3 refills | Status: DC

## 2021-03-11 NOTE — Progress Notes (Signed)
Electrophysiology Office Note:    Date:  03/12/2021   ID:  Jesse Rice, DOB 01/31/1956, MRN [id number]  PCP:  Jesse Pilon, FNP  [facility] Cardiologist:  Jesse Sprague, MD  [facility] Electrophysiologist:  Jesse Prude, MD   Referring MD: Jesse Lecher, PA-C   Chief Complaint: Atrial fibrillation  History of Present Illness:    Jesse Rice is a 65 y.o. male who presents for an evaluation of atrial fibrillation at the request of Jesse Newcomer, PA-C. Their medical history includes hypertension, irritable bowel syndrome and left bundle branch block.  The patient was last seen by Jesse Rice on February 26, 2021.  Previously he was seen by Dr. Shari Prows.  The patient underwent a transesophageal echo and cardioversion on December 23, 2020.  After this cardioversion he felt better with improved energy levels.  He saw Jesse Rice on September 30 and the patient was back in atrial fibrillation.  He was fatigued again.  Another cardioversion was set up and performed on March 04, 2021.  Again he felt better when he was back in normal rhythm but slowly his symptoms returned and today he is back in atrial fibrillation.  He is on Eliquis for stroke prophylaxis.  His CHA2DS2-VASc is 1.  He is referred for consideration of ablation.     Past Medical History:  Diagnosis Date   Atrial fibrillation (HCC)    s/p DCCV in 11/2020   Dilated aortic root (HCC)    Echo 7/22: 41 mm   Hypertension    Hypertensive pulmonary venous disease (HCC) 05/20/2014   IBS (irritable bowel syndrome)    LBBB (left bundle branch block)    rate related    Past Surgical History:  Procedure Laterality Date   CARDIOVERSION N/A 12/23/2020   Procedure: CARDIOVERSION;  Surgeon: Jesse Sprague, MD;  Location: [facility] ENDOSCOPY;  Service: Cardiovascular;  Laterality: N/A;   CARDIOVERSION N/A 03/04/2021   Procedure: CARDIOVERSION;  Surgeon: Thomasene Ripple, DO;  Location: [facility] ENDOSCOPY;  Service:  Cardiovascular;  Laterality: N/A;   CHOLECYSTECTOMY     TEE WITHOUT CARDIOVERSION N/A 12/23/2020   Procedure: TRANSESOPHAGEAL ECHOCARDIOGRAM (TEE);  Surgeon: Jesse Sprague, MD;  Location: [facility] ENDOSCOPY;  Service: Cardiovascular;  Laterality: N/A;    Current Medications: Current Meds  Medication Sig   apixaban (ELIQUIS) 5 MG TABS tablet Take 1 tablet (5 mg total) by mouth 2 (two) times daily.   atorvastatin (LIPITOR) 10 MG tablet Take 1 tablet (10 mg total) by mouth daily.   ferrous sulfate 325 (65 FE) MG tablet Take 1 tablet by mouth daily.   loperamide (IMODIUM) 2 MG capsule Take 2 mg by mouth as needed for diarrhea or loose stools.   metoprolol succinate (TOPROL XL) 50 MG 24 hr tablet Take 1 tablet (50 mg total) by mouth daily.     Allergies:   Patient has no known allergies.   Social History   Socioeconomic History   Marital status: Single    Spouse name: Not on file   Number of children: Not on file   Years of education: Not on file   Highest education level: Not on file  Occupational History   Not on file  Tobacco Use   Smoking status: Former    Packs/day: 0.50    Types: Cigarettes   Smokeless tobacco: Never  Substance and Sexual Activity   Alcohol use: No   Drug use: No   Sexual activity: Not on file  Other Topics  Concern   Not on file  Social History Narrative   Not on file   Social Determinants of Health   Financial Resource Strain: Not on file  Food Insecurity: Not on file  Transportation Needs: Not on file  Physical Activity: Not on file  Stress: Not on file  Social Connections: Not on file     Family History: The patient's family history includes Arrhythmia in his mother; COPD in his father; Diabetes in his father; Heart murmur in his father; Hypertension in his father and mother; Rheumatic fever in his father.  ROS:   Please see the history of present illness.    All other systems reviewed and are negative.  EKGs/Labs/Other Studies Reviewed:     The following studies were reviewed today:  December 17, 2020 echo Left ventricular function normal, 60% Right ventricular function normal Mild to moderate MR Mild dilation of the aortic root, 41 mm Left atrium is normal   December 23, 2020 transesophageal echo No left atrial appendage thrombus Mild to moderate MR  EKG:  The ekg ordered today demonstrates atrial fibrillation, left bundle branch block.   Recent Labs: 02/26/2021: Platelets 253 03/04/2021: BUN 15; Creatinine, Ser 1.20; Hemoglobin 13.6; Potassium 4.4; Sodium 140  Recent Lipid Panel No results found for: CHOL, TRIG, HDL, CHOLHDL, VLDL, LDLCALC, LDLDIRECT  Physical Exam:    VS:  BP 130/84   Pulse 95   Ht 6\' 4"  (1.93 m)   Wt 285 lb (129.3 kg)   BMI 34.69 kg/m     Wt Readings from Last 3 Encounters:  03/12/21 285 lb (129.3 kg)  03/04/21 283 lb 6.4 oz (128.5 kg)  02/26/21 283 lb 6.4 oz (128.5 kg)     GEN:  Well nourished, well developed in no acute distress.  Obese. HEENT: Normal NECK: No JVD; No carotid bruits LYMPHATICS: No lymphadenopathy CARDIAC: Irregularly irregular.  2 out of 6 pansystolic murmur left sternal border.  No rubs or gallops RESPIRATORY:  Clear to auscultation without rales, wheezing or rhonchi  ABDOMEN: Soft, non-tender, non-distended MUSCULOSKELETAL:  No edema; No deformity  SKIN: Warm and dry NEUROLOGIC:  Alert and oriented x 3 PSYCHIATRIC:  Normal affect       ASSESSMENT:    1. Persistent atrial fibrillation (HCC)   2. Atrial fibrillation, unspecified type (HCC)   3. Moderate mitral regurgitation   4. Dilation of aorta (HCC)   5. Primary hypertension   6. Obesity (BMI 30-39.9)    PLAN:    In order of problems listed above:  1. Persistent atrial fibrillation (HCC) 2. Atrial fibrillation, unspecified type [facility]) The patient has symptomatic persistent atrial fibrillation.  He has had 2 prior cardioversions with improved symptoms when he was back in normal rhythm.   Unfortunately, each time he is gone back in atrial fibrillation.  A rhythm control strategy is indicated.  We discussed the rhythm control options for him including antiarrhythmic drug therapy and catheter ablation.  He is not a candidate for class Ic agents because of his baseline conduction disease.  He would be a candidate for sotalol, Tikosyn or amiodarone.  He is too young in my opinion to initiate amiodarone.  I think he is an acceptable candidate for catheter ablation.  We discussed the catheter ablation procedure in detail including the risks, recovery and likelihood of success being in the 65 to 70% range at 1 year.  We discussed the possibility of needing antiarrhythmic drug therapy after an ablation.  We discussed the possibility of needing  a repeat ablation procedure in the future.  He would like to proceed with scheduling.  Risk, benefits, and alternatives to EP study and radiofrequency ablation for afib were also discussed in detail today. These risks include but are not limited to stroke, bleeding, vascular damage, tamponade, perforation, damage to the esophagus, lungs, and other structures, pulmonary vein stenosis, worsening renal function, and death. The patient understands these risk and wishes to proceed.  We will therefore proceed with catheter ablation at the next available time.  Carto, ICE, anesthesia are requested for the procedure.  Will also obtain CT PV protocol prior to the procedure to exclude LAA thrombus and further evaluate atrial anatomy.   3. Moderate mitral regurgitation 4. Dilation of aorta (HCC) Followed by Dr. Shari Prows.  5. Primary hypertension At goal today.  Continue current medication regimen.  Recommend he check his blood pressure 1-2 times per week.  Weight loss encouraged.  6. Obesity (BMI 30-39.9) We discussed the link between atrial fibrillation and obesity.  We discussed the link between obesity and atrial fibrillation ablation outcomes.  We discussed  weight loss strategies during today's appointment.  He will work on losing at least 10 pounds prior to the procedure in an effort to maximize efficacy.    Total time spent with patient today 65 minutes. This includes reviewing records, evaluating the patient and coordinating care.  Medication Adjustments/Labs and Tests Ordered: Current medicines are reviewed at length with the patient today.  Concerns regarding medicines are outlined above.  Orders Placed This Encounter  Procedures   CT CARDIAC MORPH/PULM VEIN W/CM&W/O CA SCORE   CBC w/Diff   Basic Metabolic Panel (BMET)   EKG 12-Lead   No orders of the defined types were placed in this encounter.    Signed, Rossie Muskrat. Lalla Brothers, MD, [facility], [facility] 03/12/2021 1:47 PM    Electrophysiology [facility]

## 2021-03-12 ENCOUNTER — Encounter: Payer: Self-pay | Admitting: Cardiology

## 2021-03-12 ENCOUNTER — Ambulatory Visit (HOSPITAL_BASED_OUTPATIENT_CLINIC_OR_DEPARTMENT_OTHER): Payer: BC Managed Care – PPO | Attending: Cardiology | Admitting: Cardiology

## 2021-03-12 ENCOUNTER — Ambulatory Visit (INDEPENDENT_AMBULATORY_CARE_PROVIDER_SITE_OTHER): Payer: BC Managed Care – PPO | Admitting: Cardiology

## 2021-03-12 ENCOUNTER — Other Ambulatory Visit: Payer: Self-pay

## 2021-03-12 VITALS — BP 130/84 | HR 95 | Ht 76.0 in | Wt 285.0 lb

## 2021-03-12 DIAGNOSIS — I10 Essential (primary) hypertension: Secondary | ICD-10-CM

## 2021-03-12 DIAGNOSIS — R0902 Hypoxemia: Secondary | ICD-10-CM | POA: Insufficient documentation

## 2021-03-12 DIAGNOSIS — I4891 Unspecified atrial fibrillation: Secondary | ICD-10-CM | POA: Insufficient documentation

## 2021-03-12 DIAGNOSIS — G4733 Obstructive sleep apnea (adult) (pediatric): Secondary | ICD-10-CM | POA: Diagnosis not present

## 2021-03-12 DIAGNOSIS — I4819 Other persistent atrial fibrillation: Secondary | ICD-10-CM | POA: Diagnosis not present

## 2021-03-12 DIAGNOSIS — E669 Obesity, unspecified: Secondary | ICD-10-CM

## 2021-03-12 DIAGNOSIS — I340 Nonrheumatic mitral (valve) insufficiency: Secondary | ICD-10-CM | POA: Diagnosis not present

## 2021-03-12 DIAGNOSIS — I77819 Aortic ectasia, unspecified site: Secondary | ICD-10-CM

## 2021-03-12 NOTE — Patient Instructions (Addendum)
Medication Instructions:  Your physician recommends that you continue on your current medications as directed. Please refer to the Current Medication list given to you today. *If you need a refill on your cardiac medications before your next appointment, please call your pharmacy*  Lab Work: None ordered. If you have labs (blood work) drawn today and your tests are completely normal, you will receive your results only by: MyChart Message (if you have MyChart) OR A paper copy in the mail If you have any lab test that is abnormal or we need to change your treatment, we will call you to review the results.  Testing/Procedures: None ordered.  Follow-Up: At [facility], you and your health needs are our priority.  As part of our continuing mission to provide you with exceptional heart care, we have created designated Provider Care Teams.  These Care Teams include your primary Cardiologist (physician) and Advanced Practice Providers (APPs -  Physician Assistants and Nurse Practitioners) who all work together to provide you with the care you need, when you need it.  Your next appointment:    SEE INSTRUCTION LETTER  Cardiac Ablation Cardiac ablation is a procedure to destroy, or ablate, a small amount of heart tissue in very specific places. The heart has many electrical connections. Sometimes these connections are abnormal and can cause the heart to beat very fast or irregularly. Ablating some of the areas that cause problems can improve the heart's rhythm or return it to normal. Ablation may be done for people who: Have Wolff-Parkinson-White syndrome. Have fast heart rhythms (tachycardia). Have taken medicines for an abnormal heart rhythm (arrhythmia) that were not effective or caused side effects. Have a high-risk heartbeat that may be life-threatening. During the procedure, a small incision is made in the neck or the groin, and a long, thin tube (catheter) is inserted into the incision and  moved to the heart. Small devices (electrodes) on the tip of the catheter will send out electrical currents. A type of X-ray (fluoroscopy) will be used to help guide the catheter and to provide images of the heart. Tell a health care provider about: Any allergies you have. All medicines you are taking, including vitamins, herbs, eye drops, creams, and over-the-counter medicines. Any problems you or family members have had with anesthetic medicines. Any blood disorders you have. Any surgeries you have had. Any medical conditions you have, such as kidney failure. Whether you are pregnant or may be pregnant. What are the risks? Generally, this is a safe procedure. However, problems may occur, including: Infection. Bruising and bleeding at the catheter insertion site. Bleeding into the chest, especially into the sac that surrounds the heart. This is a serious complication. Stroke or blood clots. Damage to nearby structures or organs. Allergic reaction to medicines or dyes. Need for a permanent pacemaker if the normal electrical system is damaged. A pacemaker is a small computer that sends electrical signals to the heart and helps your heart beat normally. The procedure not being fully effective. This may not be recognized until months later. Repeat ablation procedures are sometimes done. What happens before the procedure? Medicines Ask your health care provider about: Changing or stopping your regular medicines. This is especially important if you are taking diabetes medicines or blood thinners. Taking medicines such as aspirin and ibuprofen. These medicines can thin your blood. Do not take these medicines unless your health care provider tells you to take them. Taking over-the-counter medicines, vitamins, herbs, and supplements. General instructions Follow instructions from your   health care provider about eating or drinking restrictions. Plan to have someone take you home from the hospital or  clinic. If you will be going home right after the procedure, plan to have someone with you for 24 hours. Ask your health care provider what steps will be taken to prevent infection. What happens during the procedure?  An IV will be inserted into one of your veins. You will be given a medicine to help you relax (sedative). The skin on your neck or groin will be numbed. An incision will be made in your neck or your groin. A needle will be inserted through the incision and into a large vein in your neck or groin. A catheter will be inserted into the needle and moved to your heart. Dye may be injected through the catheter to help your surgeon see the area of the heart that needs treatment. Electrical currents will be sent from the catheter to ablate heart tissue in desired areas. There are three types of energy that may be used to do this: Heat (radiofrequency energy). Laser energy. Extreme cold (cryoablation). When the tissue has been ablated, the catheter will be removed. Pressure will be held on the insertion area to prevent a lot of bleeding. A bandage (dressing) will be placed over the insertion area. The exact procedure may vary among health care providers and hospitals. What happens after the procedure? Your blood pressure, heart rate, breathing rate, and blood oxygen level will be monitored until you leave the hospital or clinic. Your insertion area will be monitored for bleeding. You will need to lie still for a few hours to ensure that you do not bleed from the insertion area. Do not drive for 24 hours or as long as told by your health care provider. Summary Cardiac ablation is a procedure to destroy, or ablate, a small amount of heart tissue using an electrical current. This procedure can improve the heart rhythm or return it to normal. Tell your health care provider about any medical conditions you may have and all medicines you are taking to treat them. This is a safe procedure,  but problems may occur. Problems may include infection, bruising, damage to nearby organs or structures, or allergic reactions to medicines. Follow your health care provider's instructions about eating and drinking before the procedure. You may also be told to change or stop some of your medicines. After the procedure, do not drive for 24 hours or as long as told by your health care provider. This information is not intended to replace advice given to you by your health care provider. Make sure you discuss any questions you have with your health care provider. Document Revised: 03/25/2019 Document Reviewed: 03/25/2019 Elsevier Patient Education  2022 Elsevier Inc.  

## 2021-03-22 NOTE — Procedures (Signed)
   Patient Name: Jesse, Rice Date: 03/12/2021 Gender: Male D.O.B: Jun 28, 1955 Age (years): 5 Referring Provider: Laurance Flatten MD Height (inches): 76 Interpreting Physician: Armanda Magic MD, ABSM Weight (lbs): 282 RPSGT: [place] BMI: 34 MRN: [id number] Neck Size: 18.50  CLINICAL INFORMATION Sleep Study Type: HST  Indication for sleep study: N/A  Epworth Sleepiness Score: 5  SLEEP STUDY TECHNIQUE A multi-channel overnight portable sleep study was performed. The channels recorded were: nasal airflow, thoracic respiratory movement, and oxygen saturation with a pulse oximetry. Snoring was also monitored.  MEDICATIONS Patient self administered medications include: N/A.  SLEEP ARCHITECTURE Patient was studied for 467.3 minutes. The sleep efficiency was 99.9 % and the patient was supine for 57.5%. The arousal index was 0.0 per hour.  RESPIRATORY PARAMETERS The overall AHI was 28.8 per hour, with a central apnea index of 0 per hour.  The oxygen nadir was 81% during sleep.  CARDIAC DATA Mean heart rate during sleep was 64.0 bpm.  IMPRESSIONS - Moderate obstructive sleep apnea occurred during this study (AHI = 28.8/h). - Moderate oxygen desaturation was noted during this study (Min O2 = 81%). - Patient snored 4.6% during the sleep.  DIAGNOSIS - Obstructive Sleep Apnea (G47.33) - Nocturnal Hypoxemia (G47.36)  RECOMMENDATIONS - Recommend in lab CPAP titration.  - Positional therapy avoiding supine position during sleep. - Avoid alcohol, sedatives and other CNS depressants that may worsen sleep apnea and disrupt normal sleep architecture. - Sleep hygiene should be reviewed to assess factors that may improve sleep quality. - Weight management and regular exercise should be initiated or continued.  [Electronically signed] 03/22/2021 10:51 AM  Armanda Magic MD, ABSM Diplomate, American Board of Sleep Medicine

## 2021-03-24 ENCOUNTER — Telehealth: Payer: Self-pay | Admitting: *Deleted

## 2021-03-24 DIAGNOSIS — G4733 Obstructive sleep apnea (adult) (pediatric): Secondary | ICD-10-CM

## 2021-03-24 NOTE — Telephone Encounter (Signed)
-----   Message from Quintella Reichert, MD sent at 03/22/2021 10:52 AM EDT ----- Please let patient know that they have sleep apnea.  Recommend therapeutic CPAP titration for treatment of patient's sleep disordered breathing.  If unable to perform an in lab titration then initiate ResMed auto CPAP from 4 to 15cm H2O with heated humidity and mask of choice and overnight pulse ox on CPAP.

## 2021-04-06 ENCOUNTER — Ambulatory Visit (INDEPENDENT_AMBULATORY_CARE_PROVIDER_SITE_OTHER): Payer: BC Managed Care – PPO | Admitting: Cardiology

## 2021-04-06 ENCOUNTER — Other Ambulatory Visit: Payer: Self-pay

## 2021-04-06 ENCOUNTER — Encounter: Payer: Self-pay | Admitting: Cardiology

## 2021-04-06 ENCOUNTER — Telehealth: Payer: Self-pay | Admitting: Pharmacist

## 2021-04-06 VITALS — BP 130/72 | HR 101 | Ht 76.0 in | Wt 284.0 lb

## 2021-04-06 DIAGNOSIS — E669 Obesity, unspecified: Secondary | ICD-10-CM | POA: Diagnosis not present

## 2021-04-06 DIAGNOSIS — I5033 Acute on chronic diastolic (congestive) heart failure: Secondary | ICD-10-CM

## 2021-04-06 DIAGNOSIS — I10 Essential (primary) hypertension: Secondary | ICD-10-CM

## 2021-04-06 DIAGNOSIS — I4819 Other persistent atrial fibrillation: Secondary | ICD-10-CM

## 2021-04-06 MED ORDER — METOPROLOL SUCCINATE ER 50 MG PO TB24: 50.0000 mg | ORAL_TABLET | Freq: Two times a day (BID) | ORAL | 3 refills | Status: DC

## 2021-04-06 NOTE — Telephone Encounter (Signed)
Medication list reviewed in anticipation of upcoming Tikosyn initiation. Would recommend he avoid using Imodium since it's QTc prolonging. Other than that, no drug interactions of note.  Patient is anticoagulated on Eliquis 5mg  BID on the appropriate dose. Please ensure that patient has not missed any anticoagulation doses in the 3 weeks prior to Tikosyn initiation.   Patient will need to be counseled to avoid use of Benadryl while on Tikosyn and in the 2-3 days prior to Tikosyn initiation.

## 2021-04-06 NOTE — Progress Notes (Signed)
Electrophysiology Office Follow up Visit Note:    Date:  04/06/2021   ID:  Jesse Rice, DOB [birth year], MRN [id number]  PCP:  Soundra Pilon, FNP  [facility] Cardiologist:  Meriam Sprague, MD  [facility] Electrophysiologist:  Lanier Prude, MD    Interval History:    Jesse Rice is a 65 y.o. male who presents for a follow up visit. They were last seen in clinic March 12, 2021.  At that appointment we scheduled an ablation procedure to be performed at the end of December.  Between his last appointment and now he is gone back in atrial fibrillation and is quite symptomatic with dyspnea with minimal exertion.  He tells me that when he was at work he had to stop many times while exerting himself because of shortness of breath.  His ventricular rates are not well controlled.  He presents as an urgent add-on.       Past Medical History:  Diagnosis Date   Atrial fibrillation (HCC)    s/p DCCV in 11/2020   Dilated aortic root (HCC)    Echo 7/22: 41 mm   Hypertension    Hypertensive pulmonary venous disease (HCC) 05/20/2014   IBS (irritable bowel syndrome)    LBBB (left bundle branch block)    rate related    Past Surgical History:  Procedure Laterality Date   CARDIOVERSION N/A 12/23/2020   Procedure: CARDIOVERSION;  Surgeon: Meriam Sprague, MD;  Location: [facility] ENDOSCOPY;  Service: Cardiovascular;  Laterality: N/A;   CARDIOVERSION N/A 03/04/2021   Procedure: CARDIOVERSION;  Surgeon: Thomasene Ripple, DO;  Location: [facility] ENDOSCOPY;  Service: Cardiovascular;  Laterality: N/A;   CHOLECYSTECTOMY     TEE WITHOUT CARDIOVERSION N/A 12/23/2020   Procedure: TRANSESOPHAGEAL ECHOCARDIOGRAM (TEE);  Surgeon: Meriam Sprague, MD;  Location: [facility] ENDOSCOPY;  Service: Cardiovascular;  Laterality: N/A;    Current Medications: Current Meds  Medication Sig   apixaban (ELIQUIS) 5 MG TABS tablet Take 1 tablet (5 mg total) by mouth 2 (two) times daily.   atorvastatin  (LIPITOR) 10 MG tablet Take 1 tablet (10 mg total) by mouth daily.   ferrous sulfate 325 (65 FE) MG tablet Take 1 tablet by mouth daily.   loperamide (IMODIUM) 2 MG capsule Take 2 mg by mouth as needed for diarrhea or loose stools.   metoprolol succinate (TOPROL-XL) 50 MG 24 hr tablet Take 1 tablet (50 mg total) by mouth in the morning and at bedtime. Take with or immediately following a meal.   [DISCONTINUED] metoprolol succinate (TOPROL XL) 50 MG 24 hr tablet Take 1 tablet (50 mg total) by mouth daily.     Allergies:   Patient has no known allergies.   Social History   Socioeconomic History   Marital status: Single    Spouse name: Not on file   Number of children: Not on file   Years of education: Not on file   Highest education level: Not on file  Occupational History   Not on file  Tobacco Use   Smoking status: Former    Packs/day: 0.50    Types: Cigarettes   Smokeless tobacco: Never  Substance and Sexual Activity   Alcohol use: No   Drug use: No   Sexual activity: Not on file  Other Topics Concern   Not on file  Social History Narrative   Not on file   Social Determinants of Health   Financial Resource Strain: Not on file  Food Insecurity: Not on  file  Transportation Needs: Not on file  Physical Activity: Not on file  Stress: Not on file  Social Connections: Not on file     Family History: The patient's family history includes Arrhythmia in his mother; COPD in his father; Diabetes in his father; Heart murmur in his father; Hypertension in his father and mother; Rheumatic fever in his father.  ROS:   Please see the history of present illness.    All other systems reviewed and are negative.  EKGs/Labs/Other Studies Reviewed:    The following studies were reviewed today: Prior notes  EKG:  The ekg ordered today demonstrates atrial fibrillation with rapid ventricular rate.  Ventricular rate of 101 bpm.  Left bundle branch block.  March 12, 2021 sleep  study RECOMMENDATIONS - Recommend in lab CPAP titration.  - Positional therapy avoiding supine position during sleep. - Avoid alcohol, sedatives and other CNS depressants that may worsen sleep apnea and disrupt normal sleep architecture. - Sleep hygiene should be reviewed to assess factors that may improve sleep quality. - Weight management and regular exercise should be initiated or continued.     Recent Labs: 02/26/2021: Platelets 253 03/04/2021: BUN 15; Creatinine, Ser 1.20; Hemoglobin 13.6; Potassium 4.4; Sodium 140  Recent Lipid Panel No results found for: CHOL, TRIG, HDL, CHOLHDL, VLDL, LDLCALC, LDLDIRECT  Physical Exam:    VS:  BP 130/72   Pulse (!) 101   Ht 6\' 4"  (1.93 m)   Wt 284 lb (128.8 kg)   SpO2 98%   BMI 34.57 kg/m     Wt Readings from Last 3 Encounters:  04/06/21 284 lb (128.8 kg)  03/12/21 285 lb (129.3 kg)  03/04/21 283 lb 6.4 oz (128.5 kg)     GEN:  Well nourished, well developed in no acute distress.  Obese HEENT: Normal NECK: No JVD; No carotid bruits LYMPHATICS: No lymphadenopathy CARDIAC: Irregularly irregular, no murmurs, rubs, gallops RESPIRATORY:  Clear to auscultation without rales, wheezing or rhonchi  ABDOMEN: Soft, non-tender, non-distended MUSCULOSKELETAL:  No edema; No deformity  SKIN: Warm and dry NEUROLOGIC:  Alert and oriented x 3 PSYCHIATRIC:  Normal affect        ASSESSMENT:    1. Persistent atrial fibrillation (HCC)   2. Primary hypertension   3. Obesity (BMI 30-39.9)    PLAN:    In order of problems listed above:  #Persistent atrial fibrillation Symptomatic.  We discussed options for temporary relief from his atrial fibrillation including the initiation of an antiarrhythmic drug.  His only 2 options with the presence of a left bundle and persistent nature of his atrial fibrillation is Tikosyn or amiodarone.  Given his age I do not think amiodarone is a good long-term option.  We will plan for a Tikosyn initiation later  this month.  In the meantime, I will double his metoprolol to 50 mg by mouth twice daily. QTC is 430 ms.  #Acute on chronic diastolic heart failure Precipitated by the atrial fibrillation with rapid ventricular rates.  Rhythm control is indicated.  #Obstructive sleep apnea Awaiting CPAP titration  #Obesity Encouraged weight loss.  Did discuss the link between obesity and his atrial fibrillation during today's appointment.   Medication Adjustments/Labs and Tests Ordered: Current medicines are reviewed at length with the patient today.  Concerns regarding medicines are outlined above.  Orders Placed This Encounter  Procedures   EKG 12-Lead   Meds ordered this encounter  Medications   metoprolol succinate (TOPROL-XL) 50 MG 24 hr tablet  Sig: Take 1 tablet (50 mg total) by mouth in the morning and at bedtime. Take with or immediately following a meal.    Dispense:  180 tablet    Refill:  3     Signed, Steffanie Dunn, MD, [facility], [facility] 04/06/2021 2:55 PM    Electrophysiology [facility]

## 2021-04-06 NOTE — Patient Instructions (Addendum)
Medication Instructions:  Your physician has recommended you make the following change in your medication:    INCREASE your metoprolol succinate 50 mg-  Take one tablet by mouth twice a day  Lab Work: None ordered. If you have labs (blood work) drawn today and your tests are completely normal, you will receive your results only by: MyChart Message (if you have MyChart) OR A paper copy in the mail If you have any lab test that is abnormal or we need to change your treatment, we will call you to review the results.  Testing/Procedures: None ordered.  Follow-Up: At [facility], you and your health needs are our priority.  As part of our continuing mission to provide you with exceptional heart care, we have created designated Provider Care Teams.  These Care Teams include your primary Cardiologist (physician) and Advanced Practice Providers (APPs -  Physician Assistants and Nurse Practitioners) who all work together to provide you with the care you need, when you need it.  Your next appointment:    You will be contacted by Ventura Sellers, RN with the afib clinic to schedule your dofetilide admission  Dofetilide capsules What is this medication? DOFETILIDE (doe FET il ide) is an antiarrhythmic drug. It helps make your heart beat regularly. This medicine also helps to slow rapid heartbeats. This medicine may be used for other purposes; ask your health care provider or pharmacist if you have questions. COMMON BRAND NAME(S): Tikosyn What should I tell my care team before I take this medication? They need to know if you have any of these conditions: heart disease history of irregular heartbeat history of low levels of potassium or magnesium in the blood kidney disease liver disease an unusual or allergic reaction to dofetilide, other medicines, foods, dyes, or preservatives pregnant or trying to get pregnant breast-feeding How should I use this medication? Take this medicine by mouth  with a glass of water. Follow the directions on the prescription label. Do not take with grapefruit juice. You can take it with or without food. If it upsets your stomach, take it with food. Take your medicine at regular intervals. Do not take it more often than directed. Do not stop taking except on your doctor's advice. A special MedGuide will be given to you by the pharmacist with each prescription and refill. Be sure to read this information carefully each time. Talk to your pediatrician regarding the use of this medicine in children. Special care may be needed. Overdosage: If you think you have taken too much of this medicine contact a poison control center or emergency room at once. NOTE: This medicine is only for you. Do not share this medicine with others. What if I miss a dose? If you miss a dose, skip it. Take your next dose at the normal time. Do not take extra or 2 doses at the same time to make up for the missed dose. What may interact with this medication? Do not take this medicine with any of the following medications: cimetidine cisapride dolutegravir dronedarone erdafitinib hydrochlorothiazide ketoconazole megestrol pimozide prochlorperazine thioridazine trimethoprim verapamil This medicine may also interact with the following medications: amiloride cannabinoids certain antibiotics like erythromycin or clarithromycin certain antiviral medicines for HIV or hepatitis certain medicines for depression, anxiety, or psychotic disorders digoxin diltiazem grapefruit juice metformin nefazodone other medicines that prolong the QT interval (an abnormal heart rhythm) quinine triamterene zafirlukast ziprasidone This list may not describe all possible interactions. Give your health care provider a list of all  the medicines, herbs, non-prescription drugs, or dietary supplements you use. Also tell them if you smoke, drink alcohol, or use illegal drugs. Some items may interact  with your medicine. What should I watch for while using this medication? Your condition will be monitored carefully while you are receiving this medicine. What side effects may I notice from receiving this medication? Side effects that you should report to your doctor or health care professional as soon as possible: allergic reactions like skin rash, itching or hives, swelling of the face, lips, or tongue breathing problems chest pain or chest tightness dizziness signs and symptoms of a dangerous change in heartbeat or heart rhythm like chest pain; dizziness; fast or irregular heartbeat; palpitations; feeling faint or lightheaded, falls; breathing problems signs and symptoms of electrolyte imbalance like severe diarrhea, unusual sweating, vomiting, loss of appetite, increased thirst swelling of the ankles, legs, or feet tingling, numbness in the hands or feet Side effects that usually do not require medical attention (report to your doctor or health care professional if they continue or are bothersome): diarrhea general ill feeling or flu-like symptoms headache nausea trouble sleeping stomach pain This list may not describe all possible side effects. Call your doctor for medical advice about side effects. You may report side effects to FDA at 1-800-FDA-1088. Where should I keep my medication? Keep out of the reach of children. Store at room temperature between 15 and 30 degrees C (59 and 86 degrees F). Throw away any unused medicine after the expiration date. NOTE: This sheet is a summary. It may not cover all possible information. If you have questions about this medicine, talk to your doctor, pharmacist, or health care provider.  2022 Elsevier/Gold Standard (2021-02-02 00:00:00)

## 2021-04-07 ENCOUNTER — Telehealth: Payer: Self-pay | Admitting: *Deleted

## 2021-04-07 NOTE — Telephone Encounter (Signed)
The patient has been notified of the result and verbalized understanding.  All questions (if any) were answered. Latrelle Dodrill, CMA 04/07/2021 5:46 PM    Titration to precert

## 2021-04-07 NOTE — Telephone Encounter (Signed)
The patient has been notified of the result and verbalized understanding.    

## 2021-04-07 NOTE — Telephone Encounter (Signed)
Pt was notified he will need to stop immodium in preparation of tikosyn admission. Pt uses immodium frequently per [name] PA pt should follow up with PCP for probable Lomotil as replacement since no interaction with tikosyn.  Pt will discuss with PCP prior to admit.

## 2021-04-07 NOTE — Telephone Encounter (Signed)
-----   Message from Quintella Reichert, MD sent at 03/22/2021 10:52 AM EDT ----- Please let patient know that they have sleep apnea.  Recommend therapeutic CPAP titration for treatment of patient's sleep disordered breathing.  If unable to perform an in lab titration then initiate ResMed auto CPAP from 4 to 15cm H2O with heated humidity and mask of choice and overnight pulse ox on CPAP.

## 2021-04-08 DIAGNOSIS — R197 Diarrhea, unspecified: Secondary | ICD-10-CM | POA: Diagnosis not present

## 2021-04-08 DIAGNOSIS — I4891 Unspecified atrial fibrillation: Secondary | ICD-10-CM | POA: Diagnosis not present

## 2021-04-09 ENCOUNTER — Telehealth (HOSPITAL_COMMUNITY): Payer: Self-pay | Admitting: *Deleted

## 2021-04-09 NOTE — Telephone Encounter (Signed)
Preauthorization for tikosyn admission on 11/15 reference number [id number]

## 2021-04-12 ENCOUNTER — Other Ambulatory Visit: Payer: Self-pay | Admitting: Cardiology

## 2021-04-13 ENCOUNTER — Ambulatory Visit (HOSPITAL_COMMUNITY): Payer: BC Managed Care – PPO | Admitting: Physician Assistant

## 2021-04-13 LAB — SARS CORONAVIRUS 2 (TAT 6-24 HRS): SARS Coronavirus 2: POSITIVE — AB

## 2021-04-13 NOTE — Progress Notes (Signed)
Pt notified of covid postivie result. Isolation instructions given. Admission for today canceled and rescheduled for 11/28. Pt verbalized understanding and agreement.

## 2021-04-26 ENCOUNTER — Encounter (HOSPITAL_COMMUNITY): Payer: Self-pay | Admitting: Cardiology

## 2021-04-26 ENCOUNTER — Other Ambulatory Visit: Payer: Self-pay

## 2021-04-26 ENCOUNTER — Ambulatory Visit (HOSPITAL_COMMUNITY)
Admission: RE | Admit: 2021-04-26 | Discharge: 2021-04-26 | Disposition: A | Discharge status: Home / Self Care | Payer: BC Managed Care – PPO | Source: Ambulatory Visit | Attending: Physician Assistant | Admitting: Physician Assistant

## 2021-04-26 ENCOUNTER — Inpatient Hospital Stay (HOSPITAL_COMMUNITY)
Admission: AD | Admit: 2021-04-26 | Discharge: 2021-04-29 | DRG: 309 | Disposition: A | Discharge status: Home / Self Care | Payer: BC Managed Care – PPO | Source: Ambulatory Visit | Attending: Cardiology | Admitting: Cardiology

## 2021-04-26 VITALS — BP 146/90 | HR 94 | Ht 76.0 in | Wt 276.0 lb

## 2021-04-26 DIAGNOSIS — G4733 Obstructive sleep apnea (adult) (pediatric): Secondary | ICD-10-CM | POA: Diagnosis not present

## 2021-04-26 DIAGNOSIS — E785 Hyperlipidemia, unspecified: Secondary | ICD-10-CM | POA: Diagnosis not present

## 2021-04-26 DIAGNOSIS — I10 Essential (primary) hypertension: Secondary | ICD-10-CM | POA: Diagnosis not present

## 2021-04-26 DIAGNOSIS — Z7901 Long term (current) use of anticoagulants: Secondary | ICD-10-CM

## 2021-04-26 DIAGNOSIS — I5032 Chronic diastolic (congestive) heart failure: Secondary | ICD-10-CM | POA: Diagnosis present

## 2021-04-26 DIAGNOSIS — Z79899 Other long term (current) drug therapy: Secondary | ICD-10-CM | POA: Diagnosis not present

## 2021-04-26 DIAGNOSIS — Z8249 Family history of ischemic heart disease and other diseases of the circulatory system: Secondary | ICD-10-CM

## 2021-04-26 DIAGNOSIS — I447 Left bundle-branch block, unspecified: Secondary | ICD-10-CM | POA: Diagnosis not present

## 2021-04-26 DIAGNOSIS — I4819 Other persistent atrial fibrillation: Principal | ICD-10-CM | POA: Diagnosis present

## 2021-04-26 DIAGNOSIS — E669 Obesity, unspecified: Secondary | ICD-10-CM | POA: Diagnosis not present

## 2021-04-26 DIAGNOSIS — Z833 Family history of diabetes mellitus: Secondary | ICD-10-CM

## 2021-04-26 DIAGNOSIS — I110 Hypertensive heart disease with heart failure: Secondary | ICD-10-CM | POA: Diagnosis present

## 2021-04-26 DIAGNOSIS — Z825 Family history of asthma and other chronic lower respiratory diseases: Secondary | ICD-10-CM | POA: Diagnosis not present

## 2021-04-26 DIAGNOSIS — Z6833 Body mass index (BMI) 33.0-33.9, adult: Secondary | ICD-10-CM | POA: Diagnosis not present

## 2021-04-26 LAB — BASIC METABOLIC PANEL
Anion gap: 9 (ref 5–15)
BUN: 13 mg/dL (ref 8–23)
CO2: 25 mmol/L (ref 22–32)
Calcium: 9.5 mg/dL (ref 8.9–10.3)
Chloride: 103 mmol/L (ref 98–111)
Creatinine, Ser: 1.22 mg/dL (ref 0.61–1.24)
GFR, Estimated: >60 mL/min (ref >60)
Glucose, Bld: 97 mg/dL (ref 70–99)
Potassium: 4.6 mmol/L (ref 3.5–5.1)
Sodium: 137 mmol/L (ref 135–145)

## 2021-04-26 LAB — MAGNESIUM: Magnesium: 1.9 mg/dL (ref 1.7–2.4)

## 2021-04-26 MED ORDER — SODIUM CHLORIDE 0.9 % IV SOLN: 250.0000 mL | INTRAVENOUS | Status: DC | PRN

## 2021-04-26 MED ORDER — METOPROLOL SUCCINATE ER 50 MG PO TB24
50.0000 mg | ORAL_TABLET | Freq: Two times a day (BID) | ORAL | Status: DC
  Administered 2021-04-26 – 2021-04-29 (×6): 50 mg via ORAL
  Filled 2021-04-26 (×6): qty 1

## 2021-04-26 MED ORDER — ATORVASTATIN CALCIUM 10 MG PO TABS
10.0000 mg | ORAL_TABLET | Freq: Every day | ORAL | Status: DC
  Administered 2021-04-27 – 2021-04-29 (×3): 10 mg via ORAL
  Filled 2021-04-26 (×3): qty 1

## 2021-04-26 MED ORDER — DIPHENOXYLATE-ATROPINE 2.5-0.025 MG PO TABS
1.0000 | ORAL_TABLET | Freq: Four times a day (QID) | ORAL | Status: DC | PRN
  Administered 2021-04-26 – 2021-04-29 (×9): 1 via ORAL
  Filled 2021-04-26 (×9): qty 1

## 2021-04-26 MED ORDER — MAGNESIUM SULFATE 2 GM/50ML IV SOLN
2.0000 g | Freq: Once | INTRAVENOUS | Status: AC
  Administered 2021-04-26: 16:00:00 2 g via INTRAVENOUS
  Filled 2021-04-26: qty 50

## 2021-04-26 MED ORDER — APIXABAN 5 MG PO TABS
5.0000 mg | ORAL_TABLET | Freq: Two times a day (BID) | ORAL | Status: DC
  Administered 2021-04-26 – 2021-04-29 (×6): 5 mg via ORAL
  Filled 2021-04-26 (×6): qty 1

## 2021-04-26 MED ORDER — DOFETILIDE 500 MCG PO CAPS
500.0000 ug | ORAL_CAPSULE | Freq: Two times a day (BID) | ORAL | Status: DC
  Administered 2021-04-26 – 2021-04-29 (×6): 500 ug via ORAL
  Filled 2021-04-26 (×6): qty 1

## 2021-04-26 MED ORDER — SODIUM CHLORIDE 0.9% FLUSH
3.0000 mL | Freq: Two times a day (BID) | INTRAVENOUS | Status: DC
  Administered 2021-04-26 – 2021-04-29 (×5): 3 mL via INTRAVENOUS

## 2021-04-26 MED ORDER — SODIUM CHLORIDE 0.9% FLUSH: 3.0000 mL | INTRAVENOUS | Status: DC | PRN

## 2021-04-26 NOTE — Progress Notes (Signed)
Primary Care Physician: Soundra PilonBrake, Andrew R, FNP Primary Cardiologist: Dr Shari ProwsPemberton  Primary Electrophysiologist: Dr Lalla BrothersLambert Referring Physician: Dr Samuella CotaLambert   Jesse Rice is a 65 y.o. male with a history of HTN, LBBB, dilated aortic root, MR, OSA, atrial fibrillation who [facility]. Patient is on Eliquis for a CHADS2VASC score of 1. Patient was seen by Dr Lalla BrothersLambert on 03/12/21 and the plan was for afib ablation. However, patient had interim symptomatic afib and Dr Lalla BrothersLambert recommended AAD to help maintain SR until ablation could be completed. Patient presents today for dofetilide admission. He denies any missed doses of anticoagulation in the last 3 weeks. He is symptomatic with dyspnea on exertion.   Today, he denies symptoms of palpitations, chest pain, orthopnea, PND, lower extremity edema, dizziness, presyncope, syncope, snoring, daytime somnolence, bleeding, or neurologic sequela. The patient is tolerating medications without difficulties and is otherwise without complaint today.    Atrial Fibrillation Risk Factors:  he does have symptoms or diagnosis of sleep apnea. he is pending CPAP titration. he does not have a history of rheumatic fever.   he has a BMI of Body mass index is 33.6 kg/m.Marland Kitchen. Filed Weights   04/26/21 0952  Weight: 125.2 kg    Family History  Problem Relation Age of Onset   Hypertension Mother    Arrhythmia Mother    Rheumatic fever Father    Heart murmur Father    Hypertension Father    COPD Father    Diabetes Father      Atrial Fibrillation Management history:  Previous antiarrhythmic drugs: none Previous cardioversions: 12/23/20, 03/04/21 Previous ablations: none CHADS2VASC score: 1 Anticoagulation history: Eliquis   Past Medical History:  Diagnosis Date   Atrial fibrillation (HCC)    s/p DCCV in 11/2020   Dilated aortic root (HCC)    Echo 7/22: 41 mm   Hypertension    Hypertensive  pulmonary venous disease (HCC) 05/20/2014   IBS (irritable bowel syndrome)    LBBB (left bundle branch block)    rate related   Past Surgical History:  Procedure Laterality Date   CARDIOVERSION N/A 12/23/2020   Procedure: CARDIOVERSION;  Surgeon: Meriam SpraguePemberton, Heather E, MD;  [facility];  Service: Cardiovascular;  Laterality: N/A;   CARDIOVERSION N/A 03/04/2021   Procedure: CARDIOVERSION;  Surgeon: Thomasene Rippleobb, Kardie, DO;  Location: [facility] ENDOSCOPY;  Service: Cardiovascular;  Laterality: N/A;   CHOLECYSTECTOMY     TEE WITHOUT CARDIOVERSION N/A 12/23/2020   Procedure: TRANSESOPHAGEAL ECHOCARDIOGRAM (TEE);  Surgeon: Meriam SpraguePemberton, Heather E, MD;  [facility];  Service: Cardiovascular;  Laterality: N/A;    Current Outpatient Medications  Medication Sig Dispense Refill   apixaban (ELIQUIS) 5 MG TABS tablet Take 1 tablet (5 mg total) by mouth 2 (two) times daily. 180 tablet 2   atorvastatin (LIPITOR) 10 MG tablet Take 1 tablet (10 mg total) by mouth daily. 90 tablet 3   ferrous sulfate 325 (65 FE) MG tablet Take 1 tablet by mouth daily.     loperamide (IMODIUM) 2 MG capsule Take 2 mg by mouth as needed for diarrhea or loose stools.     metoprolol succinate (TOPROL-XL) 50 MG 24 hr tablet Take 1 tablet (50 mg total) by mouth in the morning and at bedtime. Take with or immediately following a meal. 180 tablet 3   No current facility-administered medications for this encounter.    No Known Allergies  Social History   Socioeconomic History   Marital status:  Single    Spouse name: Not on file   Number of children: Not on file   Years of education: Not on file   Highest education level: Not on file  Occupational History   Not on file  Tobacco Use   Smoking status: Former    Packs/day: 0.50    Types: Cigarettes   Smokeless tobacco: Never  Substance and Sexual Activity   Alcohol use: No   Drug use: No   Sexual activity: Not on file  Other Topics Concern   Not on file  Social  History Narrative   Not on file   Social Determinants of Health   Financial Resource Strain: Not on file  Food Insecurity: Not on file  Transportation Needs: Not on file  Physical Activity: Not on file  Stress: Not on file  Social Connections: Not on file  Intimate Partner Violence: Not on file     ROS- All systems are reviewed and negative except as per the HPI above.  Physical Exam: Vitals:   04/26/21 0952  BP: (!) 146/90  Pulse: 94  Weight: 125.2 kg  Height: 6\' 4"  (1.93 m)    GEN- The patient is a well appearing obese male, alert and oriented x 3 today.   Head- normocephalic, atraumatic Eyes-  Sclera clear, conjunctiva pink Ears- hearing intact Oropharynx- clear Neck- supple  Lungs- Clear to ausculation bilaterally, normal work of breathing Heart- irregular rate and rhythm, no murmurs, rubs or gallops  GI- soft, NT, ND, + BS Extremities- no clubbing, cyanosis, or edema MS- no significant deformity or atrophy Skin- no rash or lesion Psych- euthymic mood, full affect Neuro- strength and sensation are intact  Wt Readings from Last 3 Encounters:  04/26/21 125.2 kg  04/06/21 128.8 kg  03/12/21 129.3 kg    EKG today demonstrates  Afib, PVCs vs aberrant conduction Vent. rate 94 BPM PR interval * ms QRS duration 82 ms QT/QTcB 352/440 ms  Echo 12/17/20 demonstrated  1. Left ventricular ejection fraction, by estimation, is 60 to 65%. The  left ventricle has normal function. The left ventricle has no regional  wall motion abnormalities. Left ventricular diastolic function could not be evaluated.   2. Right ventricular systolic function is normal. The right ventricular  size is normal.   3. The mitral valve is normal in structure. Mild to moderate mitral valve regurgitation. No evidence of mitral stenosis.   4. The aortic valve is tricuspid. Aortic valve regurgitation is not  visualized. No aortic stenosis is present.   5. Aortic dilatation noted. There is mild  dilatation of the aortic root,  measuring 41 mm.   6. The inferior vena cava is normal in size with greater than 50%  respiratory variability, suggesting right atrial pressure of 3 mmHg.   Comparison(s): 05/27/14 EF 60-65%.   Epic records are reviewed at length today  CHA2DS2-VASc Score = 1  The patient's score is based upon: CHF History: 0 HTN History: 1 Diabetes History: 0 Stroke History: 0 Vascular Disease History: 0 Age Score: 0 Gender Score: 0       ASSESSMENT AND PLAN: 1. Persistent Atrial Fibrillation (ICD10:  I48.19) The patient's CHA2DS2-VASc score is 1, indicating a 0.6% annual risk of stroke.   Patient presents for dofetilide admission.  Patient aware of price of dofetilide. Continue Eliquis 5 mg BID, states no missed doses in the last 3 weeks. No recent benadryl use, patient has stopped imodium.  PharmD has screened medications Labs today show creatinine at  1.22, K+ 4.6 and mag 1.9, CrCl calculated at 111 mL/min  2. Obesity Body mass index is 33.6 kg/m. Lifestyle modification was discussed at length including regular exercise and weight reduction.  3. Obstructive sleep apnea Pending CPAP titration.   4. Chronic diastolic CHF No fluid overload today.    To be admitted later today once a bed becomes available.    [facility] [address] [place], [place] [address] [phone] 04/26/2021 10:13 AM

## 2021-04-26 NOTE — Progress Notes (Signed)
Pharmacy: Dofetilide (Tikosyn) - Initial Consult Assessment and Electrolyte Replacement  Pharmacy consulted to assist in monitoring and replacing electrolytes in this 65 y.o. male admitted on 04/26/2021 undergoing dofetilide initiation. First dofetilide dose: 04/26/21.  Assessment:  Patient Exclusion Criteria: If any screening criteria checked as "Yes", then  patient  should NOT receive dofetilide until criteria item is corrected.  If "Yes" please indicate correction plan.  YES  NO Patient  Exclusion Criteria Correction Plan   [x]   []   Baseline QTc interval is greater than or equal to 440 msec. IF above YES box checked dofetilide contraindicated unless patient has ICD; then may proceed if QTc 500-550 msec or with known ventricular conduction abnormalities may proceed with QTc 550-600 msec. QTc = 440 440 exactly - ok per EP   []   [x]   Patient is known or suspected to have a digoxin level greater than 2 ng/ml: No results found for: DIGOXIN     []   [x]   Creatinine clearance less than 20 ml/min (calculated using Cockcroft-Gault, actual body weight and serum creatinine): Estimated Creatinine Clearance: 90.6 mL/min (by C-G formula based on SCr of 1.22 mg/dL).     []   [x]  Patient has received drugs known to prolong the QT intervals within the last 48 hours (phenothiazines, tricyclics or tetracyclic antidepressants, erythromycin, H-1 antihistamines, cisapride, fluoroquinolones, azithromycin, ondansetron).   Updated information on QT prolonging agents is available to be searched on the following database:QT prolonging agents     []   [x]   Patient received a dose of hydrochlorothiazide (Oretic) alone or in any combination including triamterene (Dyazide, Maxzide) in the last 48 hours.    []   [x]  Patient received a medication known to increase dofetilide plasma concentrations prior to initial dofetilide dose:  Trimethoprim (Primsol, Proloprim) in the last 36 hours Verapamil (Calan,  Verelan) in the last 36 hours or a sustained release dose in the last 72 hours Megestrol (Megace) in the last 5 days  Cimetidine (Tagamet) in the last 6 hours Ketoconazole (Nizoral) in the last 24 hours Itraconazole (Sporanox) in the last 48 hours  Prochlorperazine (Compazine) in the last 36 hours     []   [x]   Patient is known to have a history of torsades de pointes; congenital or acquired long QT syndromes.    []   [x]   Patient has received a Class 1 antiarrhythmic with less than 2 half-lives since last dose. (Disopyramide, Quinidine, Procainamide, Lidocaine, Mexiletine, Flecainide, Propafenone)    []   [x]   Patient has received amiodarone therapy in the past 3 months or amiodarone level is greater than 0.3 ng/ml.    Patient has been appropriately anticoagulated with apixaban 5mg  BID.  Labs:    Component Value Date/Time   K 4.6 04/26/2021 1009   MG 1.9 04/26/2021 1009     Plan: Potassium: K >/= 4: Appropriate to initiate Tikosyn, no replacement needed    Magnesium: Mg 1.8-2: Give Mg 2 gm IV x1 to prevent Mg from dropping below 1.8 - do not need to recheck Mg. Appropriate to initiate Tikosyn   Thank you for allowing pharmacy to participate in this patient's care   , PharmD, BCPS, [facility] Clinical Pharmacist [phone] Please check AMION for all [facility] Pharmacy numbers 04/26/2021

## 2021-04-26 NOTE — Progress Notes (Signed)
Notified by tele pt in V Tach, at pt bedside is asymptomatic and occurs with standing. Appears to look more like rapid A fib, rhythm is in chart for reference.

## 2021-04-26 NOTE — Telephone Encounter (Signed)
Prior Authorization for titration sent to [employer] via Phone  PER Coralee North A at [employer]    APPROVED 04/26/21-06/24/21 AUTH# [id number]  PER Nina A at [employer]

## 2021-04-26 NOTE — H&P (Signed)
Electrophysiology H&P  Note    Primary Care Physician: Soundra Pilon, FNP Primary Cardiologist: Dr Shari Prows  Primary Electrophysiologist: Dr Lalla Brothers Referring Physician: Dr Samuella Cota is a 65 y.o. male with a history of HTN, LBBB, dilated aortic root, MR, OSA, atrial fibrillation who presents for consultation in the [facility]. Patient is on Eliquis for a CHADS2VASC score of 1. Patient was seen by Dr Lalla Brothers on 03/12/21 and the plan was for afib ablation. However, patient had interim symptomatic afib and Dr Lalla Brothers recommended AAD to help maintain SR until ablation could be completed. Patient presents today for dofetilide admission. He denies any missed doses of anticoagulation in the last 3 weeks. He is symptomatic with dyspnea on exertion.   Today, he denies symptoms of palpitations, chest pain, orthopnea, PND, lower extremity edema, dizziness, presyncope, syncope, snoring, daytime somnolence, bleeding, or neurologic sequela. The patient is tolerating medications without difficulties and is otherwise without complaint today.    Atrial Fibrillation Risk Factors:  he does have symptoms or diagnosis of sleep apnea. he is pending CPAP titration. he does not have a history of rheumatic fever.   he has a BMI of Body mass index is 33.46 kg/m.Marland Kitchen Filed Weights   04/26/21 1459  Weight: 124.7 kg    Family History  Problem Relation Age of Onset   Hypertension Mother    Arrhythmia Mother    Rheumatic fever Father    Heart murmur Father    Hypertension Father    COPD Father    Diabetes Father      Atrial Fibrillation Management history:  Previous antiarrhythmic drugs: none Previous cardioversions: 12/23/20, 03/04/21 Previous ablations: none CHADS2VASC score: 1 Anticoagulation history: Eliquis   Past Medical History:  Diagnosis Date   Atrial fibrillation (HCC)    s/p DCCV in 11/2020   Dilated aortic root (HCC)    Echo 7/22: 41 mm    Hypertension    Hypertensive pulmonary venous disease (HCC) 05/20/2014   IBS (irritable bowel syndrome)    LBBB (left bundle branch block)    rate related   Past Surgical History:  Procedure Laterality Date   CARDIOVERSION N/A 12/23/2020   Procedure: CARDIOVERSION;  Surgeon: Meriam Sprague, MD;  [facility] ENDOSCOPY;  Service: Cardiovascular;  Laterality: N/A;   CARDIOVERSION N/A 03/04/2021   Procedure: CARDIOVERSION;  Surgeon: Thomasene Ripple, DO;  Location: [facility] ENDOSCOPY;  Service: Cardiovascular;  Laterality: N/A;   CHOLECYSTECTOMY     TEE WITHOUT CARDIOVERSION N/A 12/23/2020   Procedure: TRANSESOPHAGEAL ECHOCARDIOGRAM (TEE);  Surgeon: Meriam Sprague, MD;  Location: [facility] ENDOSCOPY;  Service: Cardiovascular;  Laterality: N/A;    Current Facility-Administered Medications  Medication Dose Route Frequency Provider Last Rate Last Admin   0.9 %  sodium chloride infusion  250 mL Intravenous PRN Fenton, Clint R, PA       apixaban (ELIQUIS) tablet 5 mg  5 mg Oral BID Fenton, Clint R, PA       [START ON 04/27/2021] atorvastatin (LIPITOR) tablet 10 mg  10 mg Oral Daily Fenton, Clint R, PA       dofetilide (TIKOSYN) capsule 500 mcg  500 mcg Oral BID Fenton, Clint R, PA       magnesium sulfate IVPB 2 g 50 mL  2 g Intravenous Once Mosetta Anis, RPH       metoprolol succinate (TOPROL-XL) 24 hr tablet 50 mg  50 mg Oral BID Fenton, Clint R, PA  sodium chloride flush (NS) 0.9 % injection 3 mL  3 mL Intravenous Q12H Fenton, Clint R, PA       sodium chloride flush (NS) 0.9 % injection 3 mL  3 mL Intravenous PRN Fenton, Clint R, PA        No Known Allergies  Social History   Socioeconomic History   Marital status: Single    Spouse name: Not on file   Number of children: Not on file   Years of education: Not on file   Highest education level: Not on file  Occupational History   Not on file  Tobacco Use   Smoking status: Former    Packs/day: 0.50    Types: Cigarettes    Smokeless tobacco: Never  Substance and Sexual Activity   Alcohol use: No   Drug use: No   Sexual activity: Not on file  Other Topics Concern   Not on file  Social History Narrative   Not on file   Social Determinants of Health   Financial Resource Strain: Not on file  Food Insecurity: Not on file  Transportation Needs: Not on file  Physical Activity: Not on file  Stress: Not on file  Social Connections: Not on file  Intimate Partner Violence: Not on file     ROS- All systems are reviewed and negative except as per the HPI above.  Physical Exam: Vitals:   04/26/21 1459  BP: 124/87  Pulse: 84  Resp: 18  Temp: 98.6 F (37 C)  TempSrc: Oral  SpO2: 98%  Weight: 124.7 kg  Height: 6\' 4"  (1.93 m)    GEN- The patient is a well appearing obese male, alert and oriented x 3 today.   Head- normocephalic, atraumatic Eyes-  Sclera clear, conjunctiva pink Ears- hearing intact Oropharynx- clear Neck- supple  Lungs- Clear to ausculation bilaterally, normal work of breathing Heart- irregular rate and rhythm, no murmurs, rubs or gallops  GI- soft, NT, ND, + BS Extremities- no clubbing, cyanosis, or edema MS- no significant deformity or atrophy Skin- no rash or lesion Psych- euthymic mood, full affect Neuro- strength and sensation are intact  Wt Readings from Last 3 Encounters:  04/26/21 124.7 kg  04/26/21 125.2 kg  04/06/21 128.8 kg    EKG today demonstrates  Afib, PVCs vs aberrant conduction Vent. rate 94 BPM PR interval * ms QRS duration 82 ms QT/QTcB 352/440 ms  Echo 12/17/20 demonstrated  1. Left ventricular ejection fraction, by estimation, is 60 to 65%. The  left ventricle has normal function. The left ventricle has no regional  wall motion abnormalities. Left ventricular diastolic function could not be evaluated.   2. Right ventricular systolic function is normal. The right ventricular  size is normal.   3. The mitral valve is normal in structure. Mild to  moderate mitral valve regurgitation. No evidence of mitral stenosis.   4. The aortic valve is tricuspid. Aortic valve regurgitation is not  visualized. No aortic stenosis is present.   5. Aortic dilatation noted. There is mild dilatation of the aortic root,  measuring 41 mm.   6. The inferior vena cava is normal in size with greater than 50%  respiratory variability, suggesting right atrial pressure of 3 mmHg.   Comparison(s): 05/27/14 EF 60-65%.   Epic records are reviewed at length today  CHA2DS2-VASc Score = 1  The patient's score is based upon: CHF History: 0 HTN History: 1 Diabetes History: 0 Stroke History: 0 Vascular Disease History: 0 Age Score: 0 Gender  Score: 0       ASSESSMENT AND PLAN: 1. Persistent Atrial Fibrillation (ICD10:  I48.19) The patient's CHA2DS2-VASc score is 1, indicating a 0.6% annual risk of stroke.   Patient presents for dofetilide admission.  Patient aware of price of dofetilide. Continue Eliquis 5 mg BID, states no missed doses in the last 3 weeks. No recent benadryl use, patient has stopped imodium.  PharmD has screened medications Labs today show creatinine at 1.22, K+ 4.6 and mag 1.9, CrCl calculated at 111 mL/min  2. Obesity Body mass index is 33.46 kg/m. Lifestyle modification was discussed at length including regular exercise and weight reduction.  3. Obstructive sleep apnea Pending CPAP titration.   4. Chronic diastolic CHF No fluid overload today.    He presents for admission for tikosyn as above.   Casimiro Needle [address] [place], PA-C  04/26/2021 3:13 PM

## 2021-04-27 ENCOUNTER — Other Ambulatory Visit (HOSPITAL_COMMUNITY): Payer: Self-pay

## 2021-04-27 DIAGNOSIS — I4819 Other persistent atrial fibrillation: Secondary | ICD-10-CM | POA: Diagnosis not present

## 2021-04-27 LAB — BASIC METABOLIC PANEL
Anion gap: 7 (ref 5–15)
BUN: 12 mg/dL (ref 8–23)
CO2: 27 mmol/L (ref 22–32)
Calcium: 9.2 mg/dL (ref 8.9–10.3)
Chloride: 105 mmol/L (ref 98–111)
Creatinine, Ser: 1.21 mg/dL (ref 0.61–1.24)
GFR, Estimated: >60 mL/min (ref >60)
Glucose, Bld: 91 mg/dL (ref 70–99)
Potassium: 4.6 mmol/L (ref 3.5–5.1)
Sodium: 139 mmol/L (ref 135–145)

## 2021-04-27 LAB — HIV ANTIBODY (ROUTINE TESTING W REFLEX): HIV Screen 4th Generation wRfx: NONREACTIVE

## 2021-04-27 LAB — PROTIME-INR
INR: 1.2 (ref 0.8–1.2)
Prothrombin Time: 14.8 seconds (ref 11.4–15.2)

## 2021-04-27 LAB — MAGNESIUM: Magnesium: 2.1 mg/dL (ref 1.7–2.4)

## 2021-04-27 MED ORDER — SODIUM CHLORIDE 0.9 % IV SOLN: INTRAVENOUS | Status: DC

## 2021-04-27 NOTE — H&P (View-Only) (Signed)
Morning EKG reviewed    Shows pt remains in afib at 74 bpm with stable QTc at 444 ms.  Continue Tikosyn 500 mcg BID.   Pt will be NPO after midnight for DCCV if remains in afib   Graciella Freer, [place]  Pager: [phone]  04/27/2021 11:52 AM

## 2021-04-27 NOTE — Progress Notes (Signed)
Pharmacy: Dofetilide (Tikosyn) - Follow Up Assessment and Electrolyte Replacement  Pharmacy consulted to assist in monitoring and replacing electrolytes in this 65 y.o. male admitted on 04/26/2021 undergoing dofetilide initiation. First dofetilide dose: 04/26/21.  Labs:    Component Value Date/Time   K 4.6 04/27/2021 0222   MG 2.1 04/27/2021 0222     Plan: Potassium: K >/= 4: No additional supplementation needed  Magnesium: Mg > 2: No additional supplementation needed    Thank you for allowing pharmacy to participate in this patient's care   Fredonia Highland, PharmD, BCPS, [facility] Clinical Pharmacist [phone] Please check AMION for all [facility] Pharmacy numbers 04/27/2021

## 2021-04-27 NOTE — TOC Benefit Eligibility Note (Signed)
Patient Product/process development scientist completed.    The patient is currently admitted and upon discharge could be taking dofetilide (Tikosyn) 500 mcg.  Prior Authorization Required  The patient is insured through [employer]     Roland Earl, CPhT Pharmacy Patient Advocate Specialist [facility] Patient Advocate Team Direct Number: [phone]  Fax: [phone]

## 2021-04-27 NOTE — Progress Notes (Signed)
Electrophysiology Rounding Note  Patient Name: Jesse Rice Date of Encounter: 04/27/2021  Primary Cardiologist: Meriam Sprague, MD  Electrophysiologist: Lanier Prude, MD    Subjective   Pt remains in afib on Tikosyn 500 mcg BID   QTc from EKG last pm shows stable QTc at ~430  The patient is doing well today.  At this time, the patient denies chest pain, shortness of breath, or any new concerns.  Inpatient Medications    Scheduled Meds:  apixaban  5 mg Oral BID   atorvastatin  10 mg Oral Daily   dofetilide  500 mcg Oral BID   metoprolol succinate  50 mg Oral BID   sodium chloride flush  3 mL Intravenous Q12H   Continuous Infusions:  sodium chloride     PRN Meds: sodium chloride, diphenoxylate-atropine, sodium chloride flush   Vital Signs    Vitals:   04/26/21 1459 04/26/21 2011 04/27/21 0541 04/27/21 0731  BP: 124/87 103/79 123/81 (!) 136/95  Pulse: 84 78 67 77  Resp: 18 18 19 18   Temp: 98.6 F (37 C) 97.9 F (36.6 C) 97.9 F (36.6 C) 98.2 F (36.8 C)  TempSrc: Oral Oral Oral Oral  SpO2: 98% 97% 96% 95%  Weight: 124.7 kg     Height: 6\' 4"  (1.93 m)       Intake/Output Summary (Last 24 hours) at 04/27/2021 0808 Last data filed at 04/26/2021 1711 Gross per 24 hour  Intake 49.32 ml  Output --  Net 49.32 ml   Filed Weights   04/26/21 1459  Weight: 124.7 kg    Physical Exam    GEN- The patient is well appearing, alert and oriented x 3 today.   Head- normocephalic, atraumatic Eyes-  Sclera clear, conjunctiva pink Ears- hearing intact Oropharynx- clear Neck- supple Lungs- Clear to ausculation bilaterally, normal work of breathing Heart- Irregularly irregular rate and rhythm, no murmurs, rubs or gallops GI- soft, NT, ND, + BS Extremities- no clubbing, cyanosis, or edema Skin- no rash or lesion Psych- euthymic mood, full affect Neuro- strength and sensation are intact  Labs    CBC No results for input(s): WBC, NEUTROABS, HGB,  HCT, MCV, PLT in the last 72 hours. Basic Metabolic Panel Recent Labs    04/28/2021 1009 04/27/21 0222  NA 137 139  K 4.6 4.6  CL 103 105  CO2 25 27  GLUCOSE 97 91  BUN 13 12  CREATININE 1.22 1.21  CALCIUM 9.5 9.2  MG 1.9 2.1    Potassium  Date/Time Value Ref Range Status  04/27/2021 02:22 AM 4.6 3.5 - 5.1 mmol/L Final   Magnesium  Date/Time Value Ref Range Status  04/27/2021 02:22 AM 2.1 1.7 - 2.4 mg/dL Final    Comment:    Performed at [facility], [address], [place], [address] [place]    Telemetry    AF with controlled rates mostly, intermittent WCT that appears RVR with rate related RBBB  (personally reviewed)  Radiology    No results found.   Patient Profile     Jesse Rice is a 65 y.o. male with a past medical history significant for persistent atrial fibrillation.  They were admitted for tikosyn load.   Assessment & Plan    Persistent atrial fibrillation Pt remains in afib on Tikosyn 500 mcg BID  Continue Eliquis Electrolytes stable. K 4.6, MG 2.1 CHA2DS2VASC is at least 2.  If pt does not convert chemically, plan on DCCV tomorrow  2. Obesity Body mass index is 33.46 kg/m.  Lifestyle modification recommended  3. Chronic diastolic CHF Volume status stable.  For questions or updates, please contact [facility] Please consult [email or web address] for contact info under Cardiology/STEMI.  Signed, Graciella Freer, PA-C  04/27/2021, 8:08 AM

## 2021-04-27 NOTE — TOC Benefit Eligibility Note (Signed)
Patient Advocate Encounter   Received notification that prior authorization for dofetilide (Tikosyn) 500 mcg is required.   PA submitted on 04/27/2021 Key [id number] Status is pending       Jesse Rice, CPhT Pharmacy Patient Advocate Specialist [facility] Patient Advocate Team Direct Number: [phone]  Fax: [phone]

## 2021-04-27 NOTE — TOC Benefit Eligibility Note (Signed)
Patient Advocate Encounter  Prior Authorization for dofetilide (Tikosyn) 500 mcg has been approved.    PA# [id number] Effective dates: 04/27/2021 through 04/27/2022  Patients co-pay is $0.00.     Roland Earl, CPhT Pharmacy Patient Advocate Specialist [facility] Patient Advocate Team Direct Number: [phone]  Fax: [phone]

## 2021-04-27 NOTE — Progress Notes (Signed)
Morning EKG reviewed    Shows pt remains in afib at 74 bpm with stable QTc at 444 ms.  Continue Tikosyn 500 mcg BID.   Pt will be NPO after midnight for DCCV if remains in afib   Graciella Freer, [place]  Pager: [phone]  04/27/2021 11:52 AM

## 2021-04-28 ENCOUNTER — Encounter (HOSPITAL_COMMUNITY)
Admission: AD | Disposition: A | Discharge status: Home / Self Care | Payer: Self-pay | Source: Ambulatory Visit | Attending: Cardiology

## 2021-04-28 ENCOUNTER — Encounter (HOSPITAL_COMMUNITY): Payer: Self-pay | Admitting: Cardiology

## 2021-04-28 ENCOUNTER — Inpatient Hospital Stay (HOSPITAL_COMMUNITY): Payer: BC Managed Care – PPO | Admitting: Certified Registered"

## 2021-04-28 DIAGNOSIS — I4819 Other persistent atrial fibrillation: Secondary | ICD-10-CM | POA: Diagnosis not present

## 2021-04-28 HISTORY — PX: CARDIOVERSION: SHX1299

## 2021-04-28 LAB — BASIC METABOLIC PANEL
Anion gap: 7 (ref 5–15)
BUN: 12 mg/dL (ref 8–23)
CO2: 28 mmol/L (ref 22–32)
Calcium: 9.3 mg/dL (ref 8.9–10.3)
Chloride: 101 mmol/L (ref 98–111)
Creatinine, Ser: 1.13 mg/dL (ref 0.61–1.24)
GFR, Estimated: >60 mL/min (ref >60)
Glucose, Bld: 78 mg/dL (ref 70–99)
Potassium: 4.3 mmol/L (ref 3.5–5.1)
Sodium: 136 mmol/L (ref 135–145)

## 2021-04-28 LAB — MAGNESIUM: Magnesium: 2.1 mg/dL (ref 1.7–2.4)

## 2021-04-28 SURGERY — CARDIOVERSION
Anesthesia: General

## 2021-04-28 MED ORDER — LIDOCAINE 2% (20 MG/ML) 5 ML SYRINGE
INTRAMUSCULAR | Status: DC | PRN
  Administered 2021-04-28: 100 mg via INTRAVENOUS

## 2021-04-28 MED ORDER — PROPOFOL 10 MG/ML IV BOLUS
INTRAVENOUS | Status: DC | PRN
  Administered 2021-04-28: 50 mg via INTRAVENOUS

## 2021-04-28 MED ORDER — SODIUM CHLORIDE 0.9 % IV SOLN: INTRAVENOUS | Status: DC

## 2021-04-28 NOTE — Discharge Instructions (Addendum)

## 2021-04-28 NOTE — Procedures (Signed)
Procedure: Electrical Cardioversion Indications:  Atrial Fibrillation  Procedure Details:  Consent: Risks of procedure as well as the alternatives and risks of each were explained to the (patient/caregiver).  Consent for procedure obtained.  Time Out: Verified patient identification, verified procedure, site/side was marked, verified correct patient position, special equipment/implants available, medications/allergies/relevent history reviewed, required imaging and test results available. PERFORMED.  Patient placed on cardiac monitor, pulse oximetry, supplemental oxygen as necessary.  Sedation given:  Propofol 50mg ; Lidocaine 100mg  Pacer pads placed anterior and posterior chest.  Cardioverted 1 time(s).  Cardioversion with synchronized biphasic 200J shock.  Evaluation: Findings: Post procedure EKG shows:  Sinus bradycardia Complications: None Patient did tolerate procedure well.  Time Spent Directly with the Patient:    04/28/2021, 8:43 AM

## 2021-04-28 NOTE — Progress Notes (Signed)
Morning EKG reviewed    Shows  in NSR s/p DCC  at 76 bpm with borderline QTc. Auto-read as >490. Manual measurements appear closer to 460-480 range, even better in some leads. Will review with Dr. Lalla Brothers and plan to continue Tikosyn 500 mcg BID for now.   Potentially home tomorrow if QTc remains stable.    Graciella Freer, PA-C  Pager: [phone]  04/28/2021 11:46 AM

## 2021-04-28 NOTE — Telephone Encounter (Signed)
Patient is scheduled for CPAP Titration on 06/10/21. Patient understands his titration study will be done at [facility] sleep lab. Patient understands he will receive a letter in a week or so detailing appointment, date, time, and location. Patient understands to call if he does not receive the letter  in a timely manner. Patient agrees with treatment and thanked me for call.

## 2021-04-28 NOTE — Anesthesia Procedure Notes (Signed)
Procedure Name: General with mask airway Date/Time: 04/28/2021 8:36 AM Performed by: Elliot Dally, CRNA Pre-anesthesia Checklist: Patient identified, Emergency Drugs available, Suction available, Patient being monitored and Timeout performed Patient Re-evaluated:Patient Re-evaluated prior to induction Oxygen Delivery Method: Ambu bag Preoxygenation: Pre-oxygenation with 100% oxygen

## 2021-04-28 NOTE — Progress Notes (Addendum)
Electrophysiology Rounding Note  Patient Name: Jesse Rice Date of Encounter: 04/28/2021  Primary Cardiologist: Meriam Sprague, MD  Electrophysiologist: Lanier Prude, MD    Subjective   Pt remains in afib on Tikosyn 500 mcg BID   QTc from EKG last pm shows stable QTc at ~460  Seen in Endo  The patient is doing well today.  At this time, the patient denies chest pain, shortness of breath, or any new concerns.  Inpatient Medications    Scheduled Meds:  [MAR Hold] apixaban  5 mg Oral BID   [MAR Hold] atorvastatin  10 mg Oral Daily   [MAR Hold] dofetilide  500 mcg Oral BID   [MAR Hold] metoprolol succinate  50 mg Oral BID   [MAR Hold] sodium chloride flush  3 mL Intravenous Q12H   Continuous Infusions:  [MAR Hold] sodium chloride     sodium chloride     sodium chloride 10 mL/hr at 04/28/21 0833   PRN Meds: [MAR Hold] sodium chloride, [MAR Hold] diphenoxylate-atropine, [MAR Hold] sodium chloride flush   Vital Signs    Vitals:   04/27/21 1713 04/27/21 2100 04/28/21 0602 04/28/21 0749  BP: 133/85 (!) 127/98 (!) 112/97 (!) 132/99  Pulse: 65 66 66 (!) 102  Resp: 18 18 18 12   Temp: 98.3 F (36.8 C) 98 F (36.7 C) 98.2 F (36.8 C)   TempSrc: Oral Oral Oral   SpO2: 96% 96% 97% 100%  Weight:    124.7 kg  Height:    6\' 4"  (1.93 m)    Intake/Output Summary (Last 24 hours) at 04/28/2021 0837 Last data filed at 04/27/2021 2000 Gross per 24 hour  Intake 960 ml  Output --  Net 960 ml   Filed Weights   04/26/21 1459 04/28/21 0749  Weight: 124.7 kg 124.7 kg    Physical Exam    GEN- The patient is well appearing, alert and oriented x 3 today.   Head- normocephalic, atraumatic Eyes-  Sclera clear, conjunctiva pink Ears- hearing intact Oropharynx- clear Neck- supple Lungs- Clear to ausculation bilaterally, normal work of breathing Heart- Irregularly irregular rate and rhythm, no murmurs, rubs or gallops GI- soft, NT, ND, + BS Extremities- no  clubbing, cyanosis, or edema Skin- no rash or lesion Psych- euthymic mood, full affect Neuro- strength and sensation are intact  Labs    CBC No results for input(s): WBC, NEUTROABS, HGB, HCT, MCV, PLT in the last 72 hours. Basic Metabolic Panel Recent Labs    04/28/21 0222 04/28/21 0158  NA 139 136  K 4.6 4.3  CL 105 101  CO2 27 28  GLUCOSE 91 78  BUN 12 12  CREATININE 1.21 1.13  CALCIUM 9.2 9.3  MG 2.1 2.1    Potassium  Date/Time Value Ref Range Status  04/28/2021 01:58 AM 4.3 3.5 - 5.1 mmol/L Final   Magnesium  Date/Time Value Ref Range Status  04/28/2021 01:58 AM 2.1 1.7 - 2.4 mg/dL Final    Comment:    Performed at [facility], [address] [place], [address] [place]    Telemetry    AF 60-80s (personally reviewed)  Radiology    No results found.   Patient Profile     Jesse Rice is a 65 y.o. male with a past medical history significant for persistent atrial fibrillation.  They were admitted for tikosyn load.   Assessment & Plan    Persistent atrial fibrillation Pt remains in afib on Tikosyn 500 mcg  BID  Continue Eliquis Electrolytes stable.  CHA2DS2VASC is at least 2.  Seen in Endo preparing for [facility] this am. Potentially home tomorrow if QTc remains stable  2. Obesity Body mass index is 33.46 kg/m.  Lifestyle modification encouraged  3. Chronic diastolic CHF Volume status stable.    For questions or updates, please contact [facility] Please consult [email or web address] for contact info under Cardiology/STEMI.  Signed, Graciella Freer, PA-C  04/28/2021, 8:37 AM   I have seen and examined this patient with Otilio Saber.  Agree with above, note added to reflect my findings.  Patient with continued episodes of atrial fibrillation.  He is planned for cardioversion today.  Feeling symptomatic with his atrial fibrillation and read to get back into rhythm.  GEN: Well nourished, well developed, in no acute distress  HEENT:  normal  Neck: no JVD, carotid bruits, or masses Cardiac: Irregular; no murmurs, rubs, or gallops,no edema  Respiratory:  clear to auscultation bilaterally, normal work of breathing GI: soft, nontender, nondistended, + BS MS: no deformity or atrophy  Skin: warm and dry Neuro:  Strength and sensation are intact Psych: euthymic mood, full affect   1.  Persistent atrial fibrillation: Currently on dofetilide 500 mg twice daily.  QTC is remained stable.  Plan for cardioversion today.  CHA2DS2-VASc of at least 2.  We Sankalp Ferrell continue current dose of Eliquis.  Zailen Albarran M. Keevin Panebianco MD 04/28/2021 11:05 AM

## 2021-04-28 NOTE — Anesthesia Preprocedure Evaluation (Signed)
Anesthesia Evaluation  Patient identified by MRN, date of birth, ID band Patient awake    Reviewed: Allergy & Precautions, NPO status , Patient's Chart, lab work & pertinent test results  Airway Mallampati: III  TM Distance: >3 FB Neck ROM: Full    Dental  (+) Lower Dentures, Upper Dentures   Pulmonary former smoker,    Pulmonary exam normal breath sounds clear to auscultation       Cardiovascular hypertension, Pt. on home beta blockers pulmonary hypertension Rhythm:Irregular Rate:Normal     Neuro/Psych negative neurological ROS  negative psych ROS   GI/Hepatic Neg liver ROS, IBS   Endo/Other  negative endocrine ROS  Renal/GU Renal disease  negative genitourinary   Musculoskeletal negative musculoskeletal ROS (+)   Abdominal (+) + obese,   Peds  Hematology negative hematology ROS (+) HLD   Anesthesia Other Findings A-FIB  Reproductive/Obstetrics                             Anesthesia Physical  Anesthesia Plan  ASA: 3  Anesthesia Plan: General   Post-op Pain Management:    Induction: Intravenous  PONV Risk Score and Plan: 2 and Propofol infusion and Treatment may vary due to age or medical condition  Airway Management Planned: Simple Face Mask and Natural Airway  Additional Equipment: None  Intra-op Plan:   Post-operative Plan:   Informed Consent: I have reviewed the patients History and Physical, chart, labs and discussed the procedure including the risks, benefits and alternatives for the proposed anesthesia with the patient or authorized representative who has indicated his/her understanding and acceptance.       Plan Discussed with: CRNA  Anesthesia Plan Comments:         Anesthesia Quick Evaluation

## 2021-04-28 NOTE — Care Management (Signed)
04-28-21 1224 Patient presented for Tikosyn Load. Case Manager spoke with patient regarding co pay cost. Patient would like to have the initial Rx sent to [facility] and the refills 90 day supply escribed to [facility]. No further needs identified at this time.

## 2021-04-28 NOTE — Anesthesia Postprocedure Evaluation (Signed)
Anesthesia Post Note  Patient: Jesse Rice  Procedure(s) Performed: CARDIOVERSION     Patient location during evaluation: Endoscopy Anesthesia Type: General Level of consciousness: awake Pain management: pain level controlled Vital Signs Assessment: post-procedure vital signs reviewed and stable Respiratory status: spontaneous breathing Cardiovascular status: stable Postop Assessment: no apparent nausea or vomiting Anesthetic complications: no   No notable events documented.  Last Vitals:  Vitals:   04/28/21 0900 04/28/21 0910  BP: (!) 132/91 (!) 145/87  Pulse: (!) 56 68  Resp: 13 16  Temp:    SpO2: 99% 98%    Last Pain:  Vitals:   04/28/21 0910  TempSrc:   PainSc: 0-No pain                 Caren Macadam

## 2021-04-28 NOTE — Transfer of Care (Signed)
Immediate Anesthesia Transfer of Care Note  Patient: Jesse Rice  Procedure(s) Performed: CARDIOVERSION  Patient Location: Endoscopy Unit  Anesthesia Type:General  Level of Consciousness: drowsy  Airway & Oxygen Therapy: Patient Spontanous Breathing  Post-op Assessment: Report given to RN and Post -op Vital signs reviewed and stable  Post vital signs: Reviewed and stable  Last Vitals:  Vitals Value Taken Time  BP 111/75 0844  Temp    Pulse 60 0844  Resp 20 0844  SpO2 99 0844    Last Pain:  Vitals:   04/28/21 0749  TempSrc:   PainSc: 0-No pain      Patients Stated Pain Goal: 0 (04/26/21 2011)  Complications: No notable events documented.

## 2021-04-28 NOTE — Interval H&P Note (Signed)
History and Physical Interval Note:  04/28/2021 7:51 AM  Jesse Rice  has presented today for surgery, with the diagnosis of afib.  The various methods of treatment have been discussed with the patient and family. After consideration of risks, benefits and other options for treatment, the patient has consented to  Procedure(s): CARDIOVERSION (N/A) as a surgical intervention.  The patient's history has been reviewed, patient examined, no change in status, stable for surgery.  I have reviewed the patient's chart and labs.  Questions were answered to the patient's satisfaction.     Meriam Sprague

## 2021-04-28 NOTE — Progress Notes (Signed)
Pharmacy: Dofetilide (Tikosyn) - Follow Up Assessment and Electrolyte Replacement  Pharmacy consulted to assist in monitoring and replacing electrolytes in this 65 y.o. male admitted on 04/26/2021 undergoing dofetilide initiation. First dofetilide dose: 04/26/21.  Labs:    Component Value Date/Time   K 4.3 04/28/2021 0158   MG 2.1 04/28/2021 0158     Plan: Potassium: K >/= 4: No additional supplementation needed  Magnesium: Mg > 2: No additional supplementation needed    Thank you for allowing pharmacy to participate in this patient's care   Fredonia Highland, PharmD, BCPS, [facility] Clinical Pharmacist [phone] Please check AMION for all [facility] Pharmacy numbers 04/28/2021

## 2021-04-29 ENCOUNTER — Other Ambulatory Visit (HOSPITAL_COMMUNITY): Payer: Self-pay

## 2021-04-29 DIAGNOSIS — I4819 Other persistent atrial fibrillation: Secondary | ICD-10-CM | POA: Diagnosis not present

## 2021-04-29 LAB — BASIC METABOLIC PANEL
Anion gap: 7 (ref 5–15)
Anion gap: 6 (ref 5–15)
BUN: 17 mg/dL (ref 8–23)
BUN: 14 mg/dL (ref 8–23)
CO2: 25 mmol/L (ref 22–32)
CO2: 31 mmol/L (ref 22–32)
Calcium: 9.1 mg/dL (ref 8.9–10.3)
Calcium: 9.5 mg/dL (ref 8.9–10.3)
Chloride: 105 mmol/L (ref 98–111)
Chloride: 101 mmol/L (ref 98–111)
Creatinine, Ser: 1.33 mg/dL — ABNORMAL HIGH (ref 0.61–1.24)
Creatinine, Ser: 1.21 mg/dL (ref 0.61–1.24)
GFR, Estimated: >60 mL/min (ref >60)
GFR, Estimated: >60 mL/min (ref >60)
Glucose, Bld: 86 mg/dL (ref 70–99)
Glucose, Bld: 90 mg/dL (ref 70–99)
Potassium: 5.7 mmol/L — ABNORMAL HIGH (ref 3.5–5.1)
Potassium: 4.5 mmol/L (ref 3.5–5.1)
Sodium: 137 mmol/L (ref 135–145)
Sodium: 138 mmol/L (ref 135–145)

## 2021-04-29 LAB — MAGNESIUM: Magnesium: 2 mg/dL (ref 1.7–2.4)

## 2021-04-29 MED ORDER — MAGNESIUM SULFATE 2 GM/50ML IV SOLN
2.0000 g | Freq: Once | INTRAVENOUS | Status: AC
  Administered 2021-04-29: 2 g via INTRAVENOUS
  Filled 2021-04-29: qty 50

## 2021-04-29 MED ORDER — DOFETILIDE 500 MCG PO CAPS
500.0000 ug | ORAL_CAPSULE | Freq: Two times a day (BID) | ORAL | 6 refills | Status: DC
  Filled 2021-04-29: qty 60, 30d supply, fill #0

## 2021-04-29 NOTE — Progress Notes (Signed)
Pharmacy: Dofetilide (Tikosyn) - Follow Up Assessment and Electrolyte Replacement  Pharmacy consulted to assist in monitoring and replacing electrolytes in this 65 y.o. male admitted on 04/26/2021 undergoing dofetilide initiation. First dofetilide dose: 04/26/21  Labs:    Component Value Date/Time   K 4.5 04/29/2021 0553   MG 2.0 04/29/2021 0308     Plan: Potassium: K >/= 4: No additional supplementation needed  Magnesium: Mg 1.8-2: Give Mg 2 gm IV x1    As patient has required on average 0 mEq of potassium replacement every day, recommend discharging patient with prescription for:   Thank you for allowing pharmacy to participate in this patient's care   Mosetta Anis 04/29/2021  7:06 AM

## 2021-04-29 NOTE — Discharge Summary (Signed)
ELECTROPHYSIOLOGY PROCEDURE DISCHARGE SUMMARY    Patient ID: Jesse Rice,  MRN: [id number], DOB/AGE: 1956-02-27 65 y.o.  Admit date: 04/26/2021 Discharge date: 04/29/2021  Primary Care Physician: Jesse Pilon, FNP  Primary Cardiologist: Jesse Sprague, MD  Electrophysiologist: Jesse Prude, MD   Primary Discharge Diagnosis:  1.  Persistent atrial fibrillation status post Tikosyn loading this admission  No Known Allergies   Procedures This Admission:  1.  Tikosyn loading 2.  Direct current cardioversion on Wednesday April 28, 2021  by Dr Jesse Rice which successfully restored SR.  There were no early apparent complications.   Brief HPI: Jesse Rice is a 65 y.o. male with a past medical history as noted above.  They were referred to EP in the outpatient setting for treatment options of atrial fibrillation.  Risks, benefits, and alternatives to Tikosyn were reviewed with the patient who wished to proceed.    Hospital Course:  The patient was admitted and Tikosyn was initiated.  Renal function and electrolytes were followed during the hospitalization.  Their QTc remained stable.  On 04/28/2021 they underwent direct current cardioversion which restored sinus rhythm.  They were monitored until discharge on telemetry which demonstrated NSR.  On the day of discharge, they were examined by Jesse Rice  who considered them stable for discharge to home.  Follow-up has been arranged with the Atrial Fibrillation clinic in approximately 1 week and with Jesse Rice  in 4 weeks.   Physical Exam: Vitals:   04/28/21 1528 04/28/21 2035 04/29/21 0525 04/29/21 0807  BP: 104/68 112/76 118/77 111/74  Pulse: 100  (!) 120 (!) 58  Resp: 17 18 18 17   Temp: 98.1 F (36.7 C) 97.8 F (36.6 C) 97.8 F (36.6 C) 97.7 F (36.5 C)  TempSrc: Oral Oral Oral Oral  SpO2: 93%  97% 97%  Weight:      Height:        GEN- The patient is well appearing, alert and oriented x 3 today.    HEENT: normocephalic, atraumatic; sclera clear, conjunctiva pink; hearing intact; oropharynx clear; neck supple, no JVP Lymph- no cervical lymphadenopathy Lungs- Clear to ausculation bilaterally, normal work of breathing.  No wheezes, rales, rhonchi Heart- Regular rate and rhythm, no murmurs, rubs or gallops, PMI not laterally displaced GI- soft, non-tender, non-distended, bowel sounds present, no hepatosplenomegaly Extremities- no clubbing, cyanosis, or edema; DP/PT/radial pulses 2+ bilaterally MS- no significant deformity or atrophy Skin- warm and dry, no rash or lesion Psych- euthymic mood, full affect Neuro- strength and sensation are intact   Labs:   Lab Results  Component Value Date   WBC 9.7 02/26/2021   HGB 13.6 03/04/2021   HCT 40.0 03/04/2021   MCV 92 02/26/2021   PLT 253 02/26/2021    Recent Labs  Lab 04/29/21 0553  NA 138  K 4.5  CL 101  CO2 31  BUN 14  CREATININE 1.21  CALCIUM 9.5  GLUCOSE 90     Discharge Medications:  Allergies as of 04/29/2021   No Known Allergies      Medication List     TAKE these medications    apixaban 5 MG Tabs tablet Commonly known as: ELIQUIS Take 1 tablet (5 mg total) by mouth 2 (two) times daily.   atorvastatin 10 MG tablet Commonly known as: LIPITOR Take 1 tablet (10 mg total) by mouth daily.   dofetilide 500 MCG capsule Commonly known as: TIKOSYN Take 1 capsule (500 mcg total) by mouth 2 (  two) times daily.   ferrous sulfate 325 (65 FE) MG tablet Take 1 tablet by mouth daily.   Lomotil 2.5-0.025 MG tablet Generic drug: diphenoxylate-atropine Take 1 tablet by mouth 4 (four) times daily as needed for diarrhea or loose stools.   metoprolol succinate 50 MG 24 hr tablet Commonly known as: TOPROL-XL Take 1 tablet (50 mg total) by mouth in the morning and at bedtime. Take with or immediately following a meal.        Disposition:    Follow-up Information     [facility] Follow  up.   Specialty: Cardiology Why: on 12/8 at 330 for post tikosyn check Contact information: [address] [id number] [place] [address] [phone]                Duration of Discharge Encounter: Greater than 30 minutes including physician time.  Jesse Flock, PA-C  04/29/2021 12:05 PM

## 2021-04-29 NOTE — Progress Notes (Signed)
EKG from yesterday evening 04/28/21 reviewed    Shows remains in NSR at 65 bpm with stable QTc at 474 ms.  Continue  Tikosyn 500 mcg BID.   K 4.5 Mg 2.0 (Will top off)  Plan for home this afternoon if QTc remains stable after am dose.     Graciella Freer, PA-C  Pager: [phone]  04/29/2021 7:03 AM

## 2021-04-29 NOTE — Progress Notes (Signed)
Qtc 474, QT interval 0.43

## 2021-04-30 ENCOUNTER — Encounter (HOSPITAL_COMMUNITY): Payer: Self-pay | Admitting: Cardiology

## 2021-05-05 ENCOUNTER — Other Ambulatory Visit: Payer: BC Managed Care – PPO

## 2021-05-05 ENCOUNTER — Telehealth: Payer: Self-pay | Admitting: Cardiology

## 2021-05-05 ENCOUNTER — Other Ambulatory Visit: Payer: BC Managed Care – PPO | Admitting: *Deleted

## 2021-05-05 ENCOUNTER — Other Ambulatory Visit: Payer: Self-pay

## 2021-05-05 DIAGNOSIS — I10 Essential (primary) hypertension: Secondary | ICD-10-CM

## 2021-05-05 DIAGNOSIS — I4819 Other persistent atrial fibrillation: Secondary | ICD-10-CM

## 2021-05-05 DIAGNOSIS — I340 Nonrheumatic mitral (valve) insufficiency: Secondary | ICD-10-CM | POA: Diagnosis not present

## 2021-05-05 DIAGNOSIS — I4891 Unspecified atrial fibrillation: Secondary | ICD-10-CM

## 2021-05-05 DIAGNOSIS — I77819 Aortic ectasia, unspecified site: Secondary | ICD-10-CM

## 2021-05-05 NOTE — Telephone Encounter (Signed)
   Pt is requesting to speak with Boneta Lucks, he said he has some questions he would like to discuss with her

## 2021-05-06 ENCOUNTER — Other Ambulatory Visit (HOSPITAL_COMMUNITY): Payer: Self-pay

## 2021-05-06 ENCOUNTER — Ambulatory Visit (HOSPITAL_COMMUNITY)
Admit: 2021-05-06 | Discharge: 2021-05-06 | Disposition: A | Discharge status: Home / Self Care | Payer: BC Managed Care – PPO | Source: Ambulatory Visit | Attending: Physician Assistant | Admitting: Physician Assistant

## 2021-05-06 ENCOUNTER — Encounter (HOSPITAL_COMMUNITY): Payer: Self-pay | Admitting: Physician Assistant

## 2021-05-06 VITALS — BP 132/82 | HR 91 | Ht 76.0 in | Wt 277.2 lb

## 2021-05-06 DIAGNOSIS — Z7182 Exercise counseling: Secondary | ICD-10-CM | POA: Insufficient documentation

## 2021-05-06 DIAGNOSIS — I4892 Unspecified atrial flutter: Secondary | ICD-10-CM | POA: Insufficient documentation

## 2021-05-06 DIAGNOSIS — I110 Hypertensive heart disease with heart failure: Secondary | ICD-10-CM | POA: Diagnosis not present

## 2021-05-06 DIAGNOSIS — Z79899 Other long term (current) drug therapy: Secondary | ICD-10-CM | POA: Insufficient documentation

## 2021-05-06 DIAGNOSIS — I5032 Chronic diastolic (congestive) heart failure: Secondary | ICD-10-CM | POA: Insufficient documentation

## 2021-05-06 DIAGNOSIS — I447 Left bundle-branch block, unspecified: Secondary | ICD-10-CM | POA: Insufficient documentation

## 2021-05-06 DIAGNOSIS — Z7901 Long term (current) use of anticoagulants: Secondary | ICD-10-CM | POA: Diagnosis not present

## 2021-05-06 DIAGNOSIS — Z87891 Personal history of nicotine dependence: Secondary | ICD-10-CM | POA: Insufficient documentation

## 2021-05-06 DIAGNOSIS — E669 Obesity, unspecified: Secondary | ICD-10-CM | POA: Diagnosis not present

## 2021-05-06 DIAGNOSIS — Z6833 Body mass index (BMI) 33.0-33.9, adult: Secondary | ICD-10-CM | POA: Diagnosis not present

## 2021-05-06 DIAGNOSIS — Z09 Encounter for follow-up examination after completed treatment for conditions other than malignant neoplasm: Secondary | ICD-10-CM | POA: Insufficient documentation

## 2021-05-06 DIAGNOSIS — I4819 Other persistent atrial fibrillation: Secondary | ICD-10-CM | POA: Diagnosis not present

## 2021-05-06 DIAGNOSIS — G4733 Obstructive sleep apnea (adult) (pediatric): Secondary | ICD-10-CM | POA: Insufficient documentation

## 2021-05-06 LAB — CBC WITH DIFFERENTIAL/PLATELET
Basophils Absolute: 0.1 10*3/uL (ref 0.0–0.2)
Basos: 1 %
EOS (ABSOLUTE): 0.2 10*3/uL (ref 0.0–0.4)
Eos: 2 %
Hematocrit: 43.2 % (ref 37.5–51.0)
Hemoglobin: 14.6 g/dL (ref 13.0–17.7)
Immature Grans (Abs): 0 10*3/uL (ref 0.0–0.1)
Immature Granulocytes: 0 %
Lymphocytes Absolute: 3.5 10*3/uL — ABNORMAL HIGH (ref 0.7–3.1)
Lymphs: 31 %
MCH: 30.8 pg (ref 26.6–33.0)
MCHC: 33.8 g/dL (ref 31.5–35.7)
MCV: 91 fL (ref 79–97)
Monocytes Absolute: 1.1 10*3/uL — ABNORMAL HIGH (ref 0.1–0.9)
Monocytes: 10 %
Neutrophils Absolute: 6.2 10*3/uL (ref 1.4–7.0)
Neutrophils: 56 %
Platelets: 240 10*3/uL (ref 150–450)
RBC: 4.74 x10E6/uL (ref 4.14–5.80)
RDW: 12.6 % (ref 11.6–15.4)
WBC: 11.1 10*3/uL — ABNORMAL HIGH (ref 3.4–10.8)

## 2021-05-06 LAB — BASIC METABOLIC PANEL
Anion gap: 10 (ref 5–15)
BUN/Creatinine Ratio: 9 — ABNORMAL LOW (ref 10–24)
BUN: 13 mg/dL (ref 8–27)
BUN: 12 mg/dL (ref 8–23)
CO2: 24 mmol/L (ref 20–29)
CO2: 24 mmol/L (ref 22–32)
Calcium: 9.4 mg/dL (ref 8.6–10.2)
Calcium: 9.3 mg/dL (ref 8.9–10.3)
Chloride: 102 mmol/L (ref 96–106)
Chloride: 105 mmol/L (ref 98–111)
Creatinine, Ser: 1.4 mg/dL — ABNORMAL HIGH (ref 0.76–1.27)
Creatinine, Ser: 1.36 mg/dL — ABNORMAL HIGH (ref 0.61–1.24)
GFR, Estimated: 59 mL/min — ABNORMAL LOW (ref >60)
Glucose, Bld: 106 mg/dL — ABNORMAL HIGH (ref 70–99)
Glucose: 88 mg/dL (ref 70–99)
Potassium: 4.9 mmol/L (ref 3.5–5.2)
Potassium: 4.1 mmol/L (ref 3.5–5.1)
Sodium: 145 mmol/L — ABNORMAL HIGH (ref 134–144)
Sodium: 139 mmol/L (ref 135–145)
eGFR: 57 mL/min/{1.73_m2} — ABNORMAL LOW (ref >59)

## 2021-05-06 LAB — CBC
HCT: 44.4 % (ref 39.0–52.0)
Hemoglobin: 14.4 g/dL (ref 13.0–17.0)
MCH: 30.6 pg (ref 26.0–34.0)
MCHC: 32.4 g/dL (ref 30.0–36.0)
MCV: 94.3 fL (ref 80.0–100.0)
Platelets: 236 10*3/uL (ref 150–400)
RBC: 4.71 MIL/uL (ref 4.22–5.81)
RDW: 13 % (ref 11.5–15.5)
WBC: 9.7 10*3/uL (ref 4.0–10.5)
nRBC: 0 % (ref 0.0–0.2)

## 2021-05-06 LAB — MAGNESIUM: Magnesium: 2 mg/dL (ref 1.7–2.4)

## 2021-05-06 MED ORDER — DOFETILIDE 500 MCG PO CAPS
500.0000 ug | ORAL_CAPSULE | Freq: Two times a day (BID) | ORAL | 6 refills | Status: DC
  Filled 2021-05-06 – 2021-05-25 (×2): qty 60, 30d supply, fill #0
  Filled 2021-06-28: qty 60, 30d supply, fill #1
  Filled 2021-07-26: qty 60, 30d supply, fill #2
  Filled 2021-08-26: qty 60, 30d supply, fill #3
  Filled 2021-09-28 (×2): qty 60, 30d supply, fill #4
  Filled 2021-10-26: qty 60, 30d supply, fill #5
  Filled 2021-11-26: qty 60, 30d supply, fill #6

## 2021-05-06 NOTE — Progress Notes (Signed)
Primary Care Physician: Soundra Pilon, FNP Primary Cardiologist: Dr Shari Prows  Primary Electrophysiologist: Dr Lalla Brothers Referring Physician: Dr Samuella Cota is a 65 y.o. male with a history of HTN, LBBB, dilated aortic root, MR, OSA, atrial fibrillation who presents for follow up in the [facility]. Patient is on Eliquis for a CHADS2VASC score of 1. Patient was seen by Dr Lalla Brothers on 03/12/21 and the plan was for afib ablation. However, patient had interim symptomatic afib and Dr Lalla Brothers recommended AAD to help maintain SR until ablation could be completed.   On follow up today, patient is s/p dofetilide admission 11/28-12/1/22 with DCCV on 04/28/21. Unfortunately, he is in atrial flutter today but clinically he feels improved with more energy. He has noted some diarrhea since stopping imodium. No bleeding on anticoagulation.   Today, he denies symptoms of palpitations, chest pain, orthopnea, PND, lower extremity edema, dizziness, presyncope, syncope, snoring, daytime somnolence, bleeding, or neurologic sequela. The patient is tolerating medications without difficulties and is otherwise without complaint today.    Atrial Fibrillation Risk Factors:  he does have symptoms or diagnosis of sleep apnea. he is pending CPAP titration. he does not have a history of rheumatic fever.   he has a BMI of Body mass index is 33.74 kg/m.Marland Kitchen Filed Weights   05/06/21 1537  Weight: 125.7 kg     Family History  Problem Relation Age of Onset   Hypertension Mother    Arrhythmia Mother    Rheumatic fever Father    Heart murmur Father    Hypertension Father    COPD Father    Diabetes Father      Atrial Fibrillation Management history:  Previous antiarrhythmic drugs: dofetilide  Previous cardioversions: 12/23/20, 03/04/21 Previous ablations: none CHADS2VASC score: 1 Anticoagulation history: Eliquis   Past Medical History:  Diagnosis Date   Atrial  fibrillation (HCC)    s/p DCCV in 11/2020   Dilated aortic root (HCC)    Echo 7/22: 41 mm   Hypertension    Hypertensive pulmonary venous disease (HCC) 05/20/2014   IBS (irritable bowel syndrome)    LBBB (left bundle branch block)    rate related   Past Surgical History:  Procedure Laterality Date   CARDIOVERSION N/A 12/23/2020   Procedure: CARDIOVERSION;  Surgeon: Meriam Sprague, MD;  Location: [facility] ENDOSCOPY;  Service: Cardiovascular;  Laterality: N/A;   CARDIOVERSION N/A 03/04/2021   Procedure: CARDIOVERSION;  Surgeon: Thomasene Ripple, DO;  Location: [facility] ENDOSCOPY;  Service: Cardiovascular;  Laterality: N/A;   CARDIOVERSION N/A 04/28/2021   Procedure: CARDIOVERSION;  Surgeon: Meriam Sprague, MD;  Location: [facility] ENDOSCOPY;  Service: Cardiovascular;  Laterality: N/A;   CHOLECYSTECTOMY     TEE WITHOUT CARDIOVERSION N/A 12/23/2020   Procedure: TRANSESOPHAGEAL ECHOCARDIOGRAM (TEE);  Surgeon: Meriam Sprague, MD;  Location: [facility] ENDOSCOPY;  Service: Cardiovascular;  Laterality: N/A;    Current Outpatient Medications  Medication Sig Dispense Refill   apixaban (ELIQUIS) 5 MG TABS tablet Take 1 tablet (5 mg total) by mouth 2 (two) times daily. 180 tablet 2   atorvastatin (LIPITOR) 10 MG tablet Take 1 tablet (10 mg total) by mouth daily. 90 tablet 3   diphenoxylate-atropine (LOMOTIL) 2.5-0.025 MG tablet Take 1 tablet by mouth 4 (four) times daily as needed for diarrhea or loose stools.     ferrous sulfate 325 (65 FE) MG tablet Take 1 tablet by mouth daily.     metoprolol succinate (TOPROL-XL) 50 MG 24 hr tablet  Take 1 tablet (50 mg total) by mouth in the morning and at bedtime. Take with or immediately following a meal. 180 tablet 3   dofetilide (TIKOSYN) 500 MCG capsule Take 1 capsule (500 mcg total) by mouth 2 (two) times daily. 60 capsule 6   No current facility-administered medications for this encounter.    No Known Allergies  Social History   Socioeconomic History   Marital  status: Single    Spouse name: Not on file   Number of children: Not on file   Years of education: Not on file   Highest education level: Not on file  Occupational History   Not on file  Tobacco Use   Smoking status: Former    Packs/day: 0.50    Types: Cigarettes   Smokeless tobacco: Never   Tobacco comments:    Former smoker 05/06/2021  Substance and Sexual Activity   Alcohol use: No   Drug use: No   Sexual activity: Not on file  Other Topics Concern   Not on file  Social History Narrative   Not on file   Social Determinants of Health   Financial Resource Strain: Not on file  Food Insecurity: Not on file  Transportation Needs: Not on file  Physical Activity: Not on file  Stress: Not on file  Social Connections: Not on file  Intimate Partner Violence: Not on file     ROS- All systems are reviewed and negative except as per the HPI above.  Physical Exam: Vitals:   05/06/21 1537  BP: 132/82  Pulse: 91  Weight: 125.7 kg  Height: 6\' 4"  (1.93 m)    GEN- The patient is a well appearing obese male, alert and oriented x 3 today.   HEENT-head normocephalic, atraumatic, sclera clear, conjunctiva pink, hearing intact, trachea midline. Lungs- Clear to ausculation bilaterally, normal work of breathing Heart- irregular rate and rhythm, no murmurs, rubs or gallops  GI- soft, NT, ND, + BS Extremities- no clubbing, cyanosis, or edema MS- no significant deformity or atrophy Skin- no rash or lesion Psych- euthymic mood, full affect Neuro- strength and sensation are intact   Wt Readings from Last 3 Encounters:  05/06/21 125.7 kg  04/28/21 124.7 kg  04/26/21 125.2 kg    EKG today demonstrates  Atypical atrial flutter with variable block and aberrantly conducted complexes.  Vent. rate 91 BPM PR interval * ms QRS duration 140 ms QT/QTcB 410/504 ms  Echo 12/17/20 demonstrated  1. Left ventricular ejection fraction, by estimation, is 60 to 65%. The  left ventricle has  normal function. The left ventricle has no regional  wall motion abnormalities. Left ventricular diastolic function could not be evaluated.   2. Right ventricular systolic function is normal. The right ventricular  size is normal.   3. The mitral valve is normal in structure. Mild to moderate mitral valve regurgitation. No evidence of mitral stenosis.   4. The aortic valve is tricuspid. Aortic valve regurgitation is not  visualized. No aortic stenosis is present.   5. Aortic dilatation noted. There is mild dilatation of the aortic root,  measuring 41 mm.   6. The inferior vena cava is normal in size with greater than 50%  respiratory variability, suggesting right atrial pressure of 3 mmHg.   Comparison(s): 05/27/14 EF 60-65%.   Epic records are reviewed at length today  CHA2DS2-VASc Score = 1  The patient's score is based upon: CHF History: 0 HTN History: 1 Diabetes History: 0 Stroke History: 0 Vascular Disease History:  0 Age Score: 0 Gender Score: 0       ASSESSMENT AND PLAN: 1. Persistent Atrial Fibrillation (ICD10:  I48.19) The patient's CHA2DS2-VASc score is 1, indicating a 0.6% annual risk of stroke.   S/p dofetilide admission 11/28-12/1/22 with DCCV on 04/28/21 Patient in atrial flutter today.  Continue dofetilide 500 mcg BID. QT stable when corrected for wide QRS. Continue Eliquis 5 mg BID Check bmet/mag today. Continue Toprol 50 mg BID He is scheduled for afib ablation with Dr Quentin Ore on 05/28/21. D/w Dr Quentin Ore, will continue present therapy until ablation.   2. Obesity Body mass index is 33.74 kg/m. Lifestyle modification was discussed and encouraged including regular physical activity and weight reduction.  3. Obstructive sleep apnea Pending CPAP titration.   4. Chronic diastolic CHF No signs or symptoms of fluid overload.   Follow up with Dr Quentin Ore as scheduled for ablation.    [facility] [address] [place], [place] [address] [phone] 05/06/2021 4:23 PM

## 2021-05-07 MED ORDER — METOPROLOL SUCCINATE ER 50 MG PO TB24: 50.0000 mg | ORAL_TABLET | Freq: Two times a day (BID) | ORAL | 3 refills | Status: DC

## 2021-05-07 NOTE — Telephone Encounter (Signed)
Returned call to Pt.  He needed his Toprol sent to his mail order pharmacy.  Medication refilled.

## 2021-05-19 ENCOUNTER — Telehealth (HOSPITAL_COMMUNITY): Payer: Self-pay | Admitting: Emergency Medicine

## 2021-05-19 NOTE — Telephone Encounter (Signed)
Attempted to call patient regarding upcoming cardiac CT appointment. °Left message on voicemail with name and callback number °Analeya Luallen RN Navigator Cardiac Imaging ° [facility] °[phone] Office °[phone] Cell ° °

## 2021-05-19 NOTE — Telephone Encounter (Signed)
Reaching out to patient to offer assistance regarding upcoming cardiac imaging study; pt verbalizes understanding of appt date/time, parking situation and where to check in, pre-test NPO status and medications ordered, and verified current allergies; name and call back number provided for further questions should they arise Rockwell [place] RN Navigator Cardiac Imaging [facility] [phone] office [phone] cell  Arrival 7:30a Denies iv issues Daily meds, additional metoprolol suc 2 hr prior to scan [place]

## 2021-05-20 ENCOUNTER — Other Ambulatory Visit: Payer: Self-pay

## 2021-05-20 ENCOUNTER — Ambulatory Visit (HOSPITAL_COMMUNITY)
Admission: RE | Admit: 2021-05-20 | Discharge: 2021-05-20 | Disposition: A | Discharge status: Home / Self Care | Payer: BC Managed Care – PPO | Source: Ambulatory Visit | Attending: Cardiology | Admitting: Cardiology

## 2021-05-20 ENCOUNTER — Encounter (HOSPITAL_COMMUNITY): Payer: Self-pay

## 2021-05-20 DIAGNOSIS — I4891 Unspecified atrial fibrillation: Secondary | ICD-10-CM

## 2021-05-20 MED ORDER — IOHEXOL 350 MG/ML SOLN
95.0000 mL | Freq: Once | INTRAVENOUS | Status: AC | PRN
  Administered 2021-05-20: 09:00:00 95 mL via INTRAVENOUS

## 2021-05-21 ENCOUNTER — Ambulatory Visit (HOSPITAL_COMMUNITY): Payer: BC Managed Care – PPO

## 2021-05-25 ENCOUNTER — Other Ambulatory Visit (HOSPITAL_COMMUNITY): Payer: Self-pay

## 2021-05-27 NOTE — Pre-Procedure Instructions (Signed)
Attempted to call patient regarding procedure instructions for tomorrow. Mailbox full.

## 2021-05-28 ENCOUNTER — Ambulatory Visit (HOSPITAL_COMMUNITY): Payer: BC Managed Care – PPO | Admitting: Anesthesiology

## 2021-05-28 ENCOUNTER — Encounter (HOSPITAL_COMMUNITY): Payer: Self-pay | Admitting: Cardiology

## 2021-05-28 ENCOUNTER — Ambulatory Visit (HOSPITAL_COMMUNITY)
Admission: RE | Admit: 2021-05-28 | Discharge: 2021-05-29 | Disposition: A | Discharge status: Home / Self Care | Payer: BC Managed Care – PPO | Attending: Cardiology | Admitting: Cardiology

## 2021-05-28 ENCOUNTER — Encounter (HOSPITAL_COMMUNITY)
Admission: RE | Disposition: A | Discharge status: Home / Self Care | Payer: BC Managed Care – PPO | Source: Home / Self Care | Attending: Cardiology

## 2021-05-28 DIAGNOSIS — Z6834 Body mass index (BMI) 34.0-34.9, adult: Secondary | ICD-10-CM | POA: Insufficient documentation

## 2021-05-28 DIAGNOSIS — K589 Irritable bowel syndrome without diarrhea: Secondary | ICD-10-CM | POA: Diagnosis not present

## 2021-05-28 DIAGNOSIS — E669 Obesity, unspecified: Secondary | ICD-10-CM | POA: Insufficient documentation

## 2021-05-28 DIAGNOSIS — I447 Left bundle-branch block, unspecified: Secondary | ICD-10-CM | POA: Insufficient documentation

## 2021-05-28 DIAGNOSIS — I10 Essential (primary) hypertension: Secondary | ICD-10-CM | POA: Insufficient documentation

## 2021-05-28 DIAGNOSIS — I77819 Aortic ectasia, unspecified site: Secondary | ICD-10-CM | POA: Diagnosis not present

## 2021-05-28 DIAGNOSIS — I340 Nonrheumatic mitral (valve) insufficiency: Secondary | ICD-10-CM | POA: Insufficient documentation

## 2021-05-28 DIAGNOSIS — I4891 Unspecified atrial fibrillation: Secondary | ICD-10-CM | POA: Diagnosis present

## 2021-05-28 DIAGNOSIS — Z7901 Long term (current) use of anticoagulants: Secondary | ICD-10-CM | POA: Diagnosis not present

## 2021-05-28 DIAGNOSIS — I4819 Other persistent atrial fibrillation: Secondary | ICD-10-CM | POA: Diagnosis not present

## 2021-05-28 HISTORY — PX: ATRIAL FIBRILLATION ABLATION: EP1191

## 2021-05-28 LAB — POCT ACTIVATED CLOTTING TIME
Activated Clotting Time: 311 seconds
Activated Clotting Time: 317 seconds
Activated Clotting Time: 323 seconds

## 2021-05-28 SURGERY — ATRIAL FIBRILLATION ABLATION
Anesthesia: General

## 2021-05-28 MED ORDER — PROTAMINE SULFATE 10 MG/ML IV SOLN
INTRAVENOUS | Status: DC | PRN
  Administered 2021-05-28: 10 mg via INTRAVENOUS
  Administered 2021-05-28: 15 mg via INTRAVENOUS
  Administered 2021-05-28: 10 mg via INTRAVENOUS

## 2021-05-28 MED ORDER — ROCURONIUM BROMIDE 10 MG/ML (PF) SYRINGE
PREFILLED_SYRINGE | INTRAVENOUS | Status: DC | PRN
  Administered 2021-05-28: 50 mg via INTRAVENOUS
  Administered 2021-05-28: 20 mg via INTRAVENOUS
  Administered 2021-05-28: 30 mg via INTRAVENOUS

## 2021-05-28 MED ORDER — DOFETILIDE 500 MCG PO CAPS
500.0000 ug | ORAL_CAPSULE | Freq: Two times a day (BID) | ORAL | Status: DC
  Administered 2021-05-28 – 2021-05-29 (×2): 500 ug via ORAL
  Filled 2021-05-28 (×2): qty 1

## 2021-05-28 MED ORDER — ACETAMINOPHEN 325 MG PO TABS: 650.0000 mg | ORAL_TABLET | ORAL | Status: DC | PRN

## 2021-05-28 MED ORDER — FENTANYL CITRATE (PF) 250 MCG/5ML IJ SOLN
INTRAMUSCULAR | Status: DC | PRN
  Administered 2021-05-28: 100 ug via INTRAVENOUS

## 2021-05-28 MED ORDER — PANTOPRAZOLE SODIUM 40 MG PO TBEC
40.0000 mg | DELAYED_RELEASE_TABLET | Freq: Every day | ORAL | Status: DC
  Administered 2021-05-28 – 2021-05-29 (×2): 40 mg via ORAL
  Filled 2021-05-28 (×2): qty 1

## 2021-05-28 MED ORDER — HEPARIN SODIUM (PORCINE) 1000 UNIT/ML IJ SOLN
INTRAMUSCULAR | Status: DC | PRN
  Administered 2021-05-28: 18000 [IU] via INTRAVENOUS
  Administered 2021-05-28: 3000 [IU] via INTRAVENOUS
  Administered 2021-05-28: 4000 [IU] via INTRAVENOUS
  Administered 2021-05-28: 3000 [IU] via INTRAVENOUS

## 2021-05-28 MED ORDER — APIXABAN 5 MG PO TABS
5.0000 mg | ORAL_TABLET | Freq: Two times a day (BID) | ORAL | Status: DC
  Administered 2021-05-28 – 2021-05-29 (×2): 5 mg via ORAL
  Filled 2021-05-28 (×2): qty 1

## 2021-05-28 MED ORDER — PHENYLEPHRINE HCL-NACL 20-0.9 MG/250ML-% IV SOLN
INTRAVENOUS | Status: DC | PRN
  Administered 2021-05-28: 50 ug/min via INTRAVENOUS

## 2021-05-28 MED ORDER — ATORVASTATIN CALCIUM 10 MG PO TABS
10.0000 mg | ORAL_TABLET | Freq: Every day | ORAL | Status: DC
  Administered 2021-05-28 – 2021-05-29 (×2): 10 mg via ORAL
  Filled 2021-05-28 (×2): qty 1

## 2021-05-28 MED ORDER — ISOPROTERENOL HCL 0.2 MG/ML IJ SOLN
INTRAMUSCULAR | Status: AC
  Filled 2021-05-28: qty 5

## 2021-05-28 MED ORDER — MIDAZOLAM HCL 2 MG/2ML IJ SOLN
INTRAMUSCULAR | Status: DC | PRN
  Administered 2021-05-28 (×2): 1 mg via INTRAVENOUS

## 2021-05-28 MED ORDER — HEPARIN (PORCINE) IN NACL 1000-0.9 UT/500ML-% IV SOLN
INTRAVENOUS | Status: AC
  Filled 2021-05-28: qty 500

## 2021-05-28 MED ORDER — FERROUS SULFATE 325 (65 FE) MG PO TABS
325.0000 mg | ORAL_TABLET | Freq: Every day | ORAL | Status: DC
  Administered 2021-05-29: 325 mg via ORAL
  Filled 2021-05-28: qty 1

## 2021-05-28 MED ORDER — SODIUM CHLORIDE 0.9 % IV SOLN: INTRAVENOUS | Status: DC

## 2021-05-28 MED ORDER — SUGAMMADEX SODIUM 200 MG/2ML IV SOLN
INTRAVENOUS | Status: DC | PRN
  Administered 2021-05-28: 200 mg via INTRAVENOUS

## 2021-05-28 MED ORDER — HEPARIN SODIUM (PORCINE) 1000 UNIT/ML IJ SOLN
INTRAMUSCULAR | Status: DC | PRN
  Administered 2021-05-28: 1000 [IU] via INTRAVENOUS

## 2021-05-28 MED ORDER — LIDOCAINE 2% (20 MG/ML) 5 ML SYRINGE
INTRAMUSCULAR | Status: DC | PRN
  Administered 2021-05-28: 40 mg via INTRAVENOUS

## 2021-05-28 MED ORDER — SODIUM CHLORIDE 0.9% FLUSH: 3.0000 mL | INTRAVENOUS | Status: DC | PRN

## 2021-05-28 MED ORDER — ONDANSETRON HCL 4 MG/2ML IJ SOLN
INTRAMUSCULAR | Status: DC | PRN
  Administered 2021-05-28: 4 mg via INTRAVENOUS

## 2021-05-28 MED ORDER — ONDANSETRON HCL 4 MG/2ML IJ SOLN: 4.0000 mg | Freq: Four times a day (QID) | INTRAMUSCULAR | Status: DC | PRN

## 2021-05-28 MED ORDER — PHENYLEPHRINE 40 MCG/ML (10ML) SYRINGE FOR IV PUSH (FOR BLOOD PRESSURE SUPPORT)
PREFILLED_SYRINGE | INTRAVENOUS | Status: DC | PRN
  Administered 2021-05-28: 160 ug via INTRAVENOUS

## 2021-05-28 MED ORDER — HEPARIN SODIUM (PORCINE) 1000 UNIT/ML IJ SOLN
INTRAMUSCULAR | Status: AC
  Filled 2021-05-28: qty 10

## 2021-05-28 MED ORDER — SODIUM CHLORIDE 0.9% FLUSH
3.0000 mL | Freq: Two times a day (BID) | INTRAVENOUS | Status: DC
  Administered 2021-05-28 – 2021-05-29 (×2): 3 mL via INTRAVENOUS

## 2021-05-28 MED ORDER — METOPROLOL SUCCINATE ER 50 MG PO TB24: 50.0000 mg | ORAL_TABLET | Freq: Every day | ORAL | Status: DC

## 2021-05-28 MED ORDER — SODIUM CHLORIDE 0.9 % IV SOLN: 250.0000 mL | INTRAVENOUS | Status: DC | PRN

## 2021-05-28 MED ORDER — ISOPROTERENOL HCL 0.2 MG/ML IJ SOLN
INTRAVENOUS | Status: DC | PRN
  Administered 2021-05-28: 4 ug/min via INTRAVENOUS

## 2021-05-28 MED ORDER — COLCHICINE 0.6 MG PO TABS
0.6000 mg | ORAL_TABLET | Freq: Two times a day (BID) | ORAL | Status: DC
  Administered 2021-05-28 – 2021-05-29 (×2): 0.6 mg via ORAL
  Filled 2021-05-28 (×2): qty 1

## 2021-05-28 MED ORDER — DIPHENOXYLATE-ATROPINE 2.5-0.025 MG PO TABS
1.0000 | ORAL_TABLET | Freq: Four times a day (QID) | ORAL | Status: DC | PRN
  Administered 2021-05-29: 1 via ORAL
  Filled 2021-05-28: qty 1

## 2021-05-28 MED ORDER — HEPARIN (PORCINE) IN NACL 1000-0.9 UT/500ML-% IV SOLN
INTRAVENOUS | Status: DC | PRN
  Administered 2021-05-28 (×4): 500 mL

## 2021-05-28 MED ORDER — METOPROLOL SUCCINATE ER 50 MG PO TB24
50.0000 mg | ORAL_TABLET | Freq: Two times a day (BID) | ORAL | Status: DC
  Administered 2021-05-28: 20:00:00 50 mg via ORAL
  Filled 2021-05-28 (×2): qty 1

## 2021-05-28 MED ORDER — PROPOFOL 10 MG/ML IV BOLUS
INTRAVENOUS | Status: DC | PRN
  Administered 2021-05-28: 140 mg via INTRAVENOUS

## 2021-05-28 MED ORDER — DEXAMETHASONE SODIUM PHOSPHATE 10 MG/ML IJ SOLN
INTRAMUSCULAR | Status: DC | PRN
  Administered 2021-05-28: 10 mg via INTRAVENOUS

## 2021-05-28 SURGICAL SUPPLY — 20 items
CATH 8FR REPROCESSED SOUNDSTAR (CATHETERS) ×2 IMPLANT
CATH 8FR SOUNDSTAR REPROCESSED (CATHETERS) IMPLANT
CATH OCTARAY 2.0 F 3-3-3-3-3 (CATHETERS) ×1 IMPLANT
CATH S CIRCA THERM PROBE 10F (CATHETERS) ×1 IMPLANT
CATH SMTCH THERMOCOOL SF DF (CATHETERS) ×1 IMPLANT
CATH SOUNDSTAR ECO 8FR (CATHETERS) ×1 IMPLANT
CATH WEBSTER BI DIR CS D-F CRV (CATHETERS) ×1 IMPLANT
CLOSURE PERCLOSE PROSTYLE (VASCULAR PRODUCTS) ×3 IMPLANT
COVER SWIFTLINK CONNECTOR (BAG) ×2 IMPLANT
MAT PREVALON FULL STRYKER (MISCELLANEOUS) ×1 IMPLANT
PACK EP LATEX FREE (CUSTOM PROCEDURE TRAY) ×2
PACK EP LF (CUSTOM PROCEDURE TRAY) ×1 IMPLANT
PAD DEFIB RADIO PHYSIO CONN (PAD) ×2 IMPLANT
PATCH CARTO3 (PAD) ×1 IMPLANT
SHEATH BAYLIS TRANSSEPTAL 98CM (NEEDLE) ×1 IMPLANT
SHEATH CARTO VIZIGO SM CVD (SHEATH) ×1 IMPLANT
SHEATH PINNACLE 8F 10CM (SHEATH) ×2 IMPLANT
SHEATH PINNACLE 9F 10CM (SHEATH) ×1 IMPLANT
TUBING SMART ABLATE COOLFLOW (TUBING) ×1 IMPLANT
WIRE HI TORQ VERSACORE-J 145CM (WIRE) ×1 IMPLANT

## 2021-05-28 NOTE — Anesthesia Postprocedure Evaluation (Signed)
Anesthesia Post Note  Patient: Jesse Rice  Procedure(s) Performed: ATRIAL FIBRILLATION ABLATION     Anesthesia Type: General Level of consciousness: awake and alert Pain management: pain level controlled Vital Signs Assessment: post-procedure vital signs reviewed and stable Respiratory status: spontaneous breathing, nonlabored ventilation and respiratory function stable Cardiovascular status: blood pressure returned to baseline and stable Postop Assessment: no apparent nausea or vomiting Anesthetic complications: no   No notable events documented.  Last Vitals:  Vitals:   05/28/21 1610 05/28/21 1615  BP: (!) 114/58 122/65  Pulse: 78 71  Resp: 14 14  Temp:  36.7 C  SpO2: 99% 98%    Last Pain:  Vitals:   05/28/21 1615  TempSrc: Temporal  PainSc: 0-No pain                 Lucretia Kern

## 2021-05-28 NOTE — Discharge Instructions (Signed)

## 2021-05-28 NOTE — Discharge Summary (Signed)
ELECTROPHYSIOLOGY PROCEDURE DISCHARGE SUMMARY    Patient ID: Jesse Rice,  MRN: [id number], DOB/AGE: [birth year] 65 y.o.  Admit date: 05/28/2021 Discharge date: 05/29/2021  Primary Care Physician: Kristen Loader, FNP  Primary Cardiologist: Freada Bergeron, MD  Electrophysiologist: Dr. Quentin Ore  Primary Discharge Diagnosis:  Atrial Fibrillation   Procedures This Admission:  1.  Electrophysiology study and radiofrequency catheter ablation of Atrial Fibrillation on 05/28/2021 by  Dr Quentin Ore .   This study demonstrated: i. Successful PVI ii. Successful ablation/isolation of the posterior wall iii. Intracardiac echo reveals trivial to small pericardial effusion, left-sided common ostium, normal left ventricular function iv. No early apparent complications. v.  Colchicine 0.6 mg by mouth twice daily for 5 days vi.  Protonix 40 mg by mouth once daily for 45 days    Brief HPI: Jesse Rice is a 65 y.o. male with a history of persistent atrial fibrillation Atrial Fibrillation.  They had previously been treated with Tikosyn due to recurrent, symptomatic atrial fibrillation.  Risks, benefits, and alternatives to catheter ablation of Atrial Fibrillation were reviewed with the patient who wished to proceed.  The patient had been on Kula uninterrupted for at least 3 weeks prior to surgery.    Hospital Course:  The patient was admitted and underwent EPS/RFCA of Atrial Fibrillation with details as outlined above.  They were monitored on telemetry overnight which demonstrated sinus rhythm with PACs, PVCs and nonsustained atrial tachycardia.  Groin was without complication on the day of discharge.  The patient was examined and considered to be stable for discharge.  Wound care and restrictions were reviewed with the patient.  The patient will be seen back by Adline Peals, PA in 4 weeks and  Dr Quentin Ore  in 12 weeks for post ablation follow up.   This patients CHA2DS2-VASc Score and  unadjusted Ischemic Stroke Rate (% per year) is equal to 2.2 % stroke rate/year from a score of 2 Above score calculated as 1 point each if present [CHF, HTN, DM, Vascular=MI/PAD/Aortic Plaque, Age if 65-74, or Male] Above score calculated as 2 points each if present [Age > 75, or Stroke/TIA/TE]         Physical Exam: Vitals:   05/28/21 1950 05/29/21 0018 05/29/21 0440 05/29/21 0839  BP: 132/83 112/68 123/74 126/75  Pulse: 97 85  60  Resp: 17 20  19   Temp: 98 F (36.7 C) 98.1 F (36.7 C) 97.6 F (36.4 C) 97.7 F (36.5 C)  TempSrc: Oral Oral Oral Oral  SpO2: 97%   97%  Weight:   121.2 kg   Height:        GEN- The patient is well appearing, alert and oriented x 3 today.   HEENT: normocephalic, atraumatic; sclera clear, conjunctiva pink; hearing intact; oropharynx clear; neck supple  Lungs- Clear to ausculation bilaterally, normal work of breathing.  No wheezes, rales, rhonchi Heart- Regular rate and rhythm, no murmurs, rubs or gallops  GI- soft, non-tender, non-distended, bowel sounds present  Extremities- no clubbing, cyanosis, or edema; DP/PT/radial pulses 2+ bilaterally, groin without hematoma/bruit MS- no significant deformity or atrophy Skin- warm and dry, no rash or lesion Psych- euthymic mood, full affect Neuro- strength and sensation are intact   Labs:   Lab Results  Component Value Date   WBC 9.7 05/06/2021   HGB 14.4 05/06/2021   HCT 44.4 05/06/2021   MCV 94.3 05/06/2021   PLT 236 05/06/2021   No results for input(s): NA, K, CL, CO2, BUN, CREATININE,  CALCIUM, PROT, BILITOT, ALKPHOS, ALT, AST, GLUCOSE in the last 168 hours.  Invalid input(s): LABALBU   Discharge Medications:  Allergies as of 05/29/2021   No Known Allergies      Medication List     TAKE these medications    apixaban 5 MG Tabs tablet Commonly known as: ELIQUIS Take 1 tablet (5 mg total) by mouth 2 (two) times daily.   atorvastatin 10 MG tablet Commonly known as: LIPITOR Take 1  tablet (10 mg total) by mouth daily.   colchicine 0.6 MG tablet Take 1 tablet (0.6 mg total) by mouth 2 (two) times daily for 5 days.   diphenoxylate-atropine 2.5-0.025 MG tablet Commonly known as: LOMOTIL Take 1 tablet by mouth 4 (four) times daily as needed for diarrhea or loose stools.   dofetilide 500 MCG capsule Commonly known as: TIKOSYN Take 1 capsule (500 mcg total) by mouth 2 (two) times daily.   ferrous sulfate 325 (65 FE) MG tablet Take 1 tablet by mouth daily.   metoprolol succinate 50 MG 24 hr tablet Commonly known as: TOPROL-XL Take 1 tablet (50 mg total) by mouth in the morning and at bedtime. Take with or immediately following a meal.   pantoprazole 40 MG tablet Commonly known as: PROTONIX Take 1 tablet (40 mg total) by mouth daily. Start taking on: May 30, 2021        Disposition:    Follow-up Information     [facility] Follow up.   Specialty: Cardiology Why: on 1/31 at 330 for post ablation follow up Contact information: [address] [id number] mc [place] [address] [phone]                Duration of Discharge Encounter: Greater than 30 minutes including physician time.  Signed, Hillis Range, MD  05/29/2021 9:25 AM

## 2021-05-28 NOTE — Transfer of Care (Signed)
Immediate Anesthesia Transfer of Care Note  Patient: Jesse Rice  Procedure(s) Performed: ATRIAL FIBRILLATION ABLATION  Patient Location: PACU  Anesthesia Type:General  Level of Consciousness: awake, alert  and oriented  Airway & Oxygen Therapy: Patient Spontanous Breathing and Patient connected to nasal cannula oxygen  Post-op Assessment: Report given to RN, Post -op Vital signs reviewed and stable and Patient moving all extremities X 4  Post vital signs: Reviewed and stable  Last Vitals:  Vitals Value Taken Time  BP 118/69 05/28/21 1551  Temp 36.6 C 05/28/21 1545  Pulse 74 05/28/21 1556  Resp 11 05/28/21 1556  SpO2 98 % 05/28/21 1556  Vitals shown include unvalidated device data.  Last Pain:  Vitals:   05/28/21 1545  TempSrc: Temporal  PainSc: 0-No pain         Complications: No notable events documented.

## 2021-05-28 NOTE — Anesthesia Preprocedure Evaluation (Addendum)
Anesthesia Evaluation  Patient identified by MRN, date of birth, ID band Patient awake    Reviewed: Allergy & Precautions, NPO status , Patient's Chart, lab work & pertinent test results  Airway Mallampati: I  TM Distance: >3 FB Neck ROM: Full    Dental  (+) Edentulous Upper, Edentulous Lower   Pulmonary former smoker,    breath sounds clear to auscultation       Cardiovascular hypertension, Pt. on home beta blockers + dysrhythmias Atrial Fibrillation  Rhythm:Regular Rate:Normal  Echo: 1. Left ventricular ejection fraction, by estimation, is 60 to 65%. The  left ventricle has normal function.  2. Right ventricular systolic function is normal. The right ventricular  size is normal.  3. No left atrial/left atrial appendage thrombus was detected.  4. The mitral valve is normal in structure. There is mild-to-moderate vs  moderate mitral regurgitation with multiple jets seen along the coaptation  line. Degree of MR varied with blood pressure during the study. No  evidence of mitral stenosis.  5. The aortic valve is tricuspid. There is mild thickening of the aortic  valve. Aortic valve regurgitation is not visualized. Mild aortic valve  sclerosis is present, with no evidence of aortic valve stenosis.  6. There is mild (Grade II) plaque.    Neuro/Psych negative neurological ROS  negative psych ROS   GI/Hepatic negative GI ROS, Neg liver ROS,   Endo/Other  negative endocrine ROS  Renal/GU negative Renal ROS     Musculoskeletal negative musculoskeletal ROS (+)   Abdominal Normal abdominal exam  (+)   Peds  Hematology negative hematology ROS (+)   Anesthesia Other Findings   Reproductive/Obstetrics                           Anesthesia Physical Anesthesia Plan  ASA: 3  Anesthesia Plan: General   Post-op Pain Management:    Induction: Intravenous  PONV Risk Score and Plan: 2 and  Ondansetron and Midazolam  Airway Management Planned: Oral ETT  Additional Equipment: None  Intra-op Plan:   Post-operative Plan: Extubation in OR  Informed Consent: I have reviewed the patients History and Physical, chart, labs and discussed the procedure including the risks, benefits and alternatives for the proposed anesthesia with the patient or authorized representative who has indicated his/her understanding and acceptance.     Dental advisory given  Plan Discussed with: CRNA  Anesthesia Plan Comments:        Anesthesia Quick Evaluation

## 2021-05-28 NOTE — Anesthesia Procedure Notes (Signed)
Procedure Name: Intubation Date/Time: 05/28/2021 1:06 PM Performed by: Marena Chancy, CRNA Pre-anesthesia Checklist: Patient identified, Emergency Drugs available, Suction available and Patient being monitored Patient Re-evaluated:Patient Re-evaluated prior to induction Oxygen Delivery Method: Circle System Utilized Preoxygenation: Pre-oxygenation with 100% oxygen Induction Type: IV induction Ventilation: Mask ventilation without difficulty and Oral airway inserted - appropriate to patient size Laryngoscope Size: Hyacinth Meeker and 3 Grade View: Grade I Tube type: Oral Tube size: 8.0 mm Number of attempts: 1 Airway Equipment and Method: Stylet and Oral airway Placement Confirmation: ETT inserted through vocal cords under direct vision, positive ETCO2 and breath sounds checked- equal and bilateral Tube secured with: Tape Dental Injury: Teeth and Oropharynx as per pre-operative assessment

## 2021-05-28 NOTE — H&P (Signed)
Electrophysiology Office Note:     Date:  05/28/2021    ID:  Macdonald, Rigor 1955-07-25, MRN [id number]   PCP:  Soundra Pilon, FNP        [facility] Cardiologist:  Meriam Sprague, MD  [facility] Electrophysiologist:  Lanier Prude, MD    Referring MD: Beatrice Lecher, PA-C    Chief Complaint: Atrial fibrillation   History of Present Illness:     Jesse Rice is a 65 y.o. male who presents for an evaluation of atrial fibrillation at the request of Tereso Newcomer, PA-C. Their medical history includes hypertension, irritable bowel syndrome and left bundle branch block.  The patient was last seen by Tereso Newcomer on February 26, 2021.  Previously he was seen by Dr. Shari Prows.  The patient underwent a transesophageal echo and cardioversion on December 23, 2020.  After this cardioversion he felt better with improved energy levels.  He saw Tereso Newcomer on September 30 and the patient was back in atrial fibrillation.  He was fatigued again.  Another cardioversion was set up and performed on March 04, 2021.  Again he felt better when he was back in normal rhythm but slowly his symptoms returned and today he is back in atrial fibrillation.  He is on Eliquis for stroke prophylaxis.  His CHA2DS2-VASc is 1.  He is referred for consideration of ablation.       Objective        Past Medical History:  Diagnosis Date   Atrial fibrillation (HCC)      s/p DCCV in 11/2020   Dilated aortic root (HCC)      Echo 7/22: 41 mm   Hypertension     Hypertensive pulmonary venous disease (HCC) 05/20/2014   IBS (irritable bowel syndrome)     LBBB (left bundle branch block)      rate related           Past Surgical History:  Procedure Laterality Date   CARDIOVERSION N/A 12/23/2020    Procedure: CARDIOVERSION;  Surgeon: Meriam Sprague, MD;  Location: [facility] ENDOSCOPY;  Service: Cardiovascular;  Laterality: N/A;   CARDIOVERSION N/A 03/04/2021    Procedure: CARDIOVERSION;  Surgeon:  Thomasene Ripple, DO;  Location: [facility] ENDOSCOPY;  Service: Cardiovascular;  Laterality: N/A;   CHOLECYSTECTOMY       TEE WITHOUT CARDIOVERSION N/A 12/23/2020    Procedure: TRANSESOPHAGEAL ECHOCARDIOGRAM (TEE);  Surgeon: Meriam Sprague, MD;  Location: [facility] ENDOSCOPY;  Service: Cardiovascular;  Laterality: N/A;      Current Medications: Active Medications      Current Meds  Medication Sig   apixaban (ELIQUIS) 5 MG TABS tablet Take 1 tablet (5 mg total) by mouth 2 (two) times daily.   atorvastatin (LIPITOR) 10 MG tablet Take 1 tablet (10 mg total) by mouth daily.   ferrous sulfate 325 (65 FE) MG tablet Take 1 tablet by mouth daily.   loperamide (IMODIUM) 2 MG capsule Take 2 mg by mouth as needed for diarrhea or loose stools.   metoprolol succinate (TOPROL XL) 50 MG 24 hr tablet Take 1 tablet (50 mg total) by mouth daily.        Allergies:   Patient has no known allergies.    Social History         Socioeconomic History   Marital status: Single      Spouse name: Not on file   Number of children: Not on file   Years of education: Not on file  Highest education level: Not on file  Occupational History   Not on file  Tobacco Use   Smoking status: Former      Packs/day: 0.50      Types: Cigarettes   Smokeless tobacco: Never  Substance and Sexual Activity   Alcohol use: No   Drug use: No   Sexual activity: Not on file  Other Topics Concern   Not on file  Social History Narrative   Not on file    Social Determinants of Health    Financial Resource Strain: Not on file  Food Insecurity: Not on file  Transportation Needs: Not on file  Physical Activity: Not on file  Stress: Not on file  Social Connections: Not on file      Family History: The patient's family history includes Arrhythmia in his mother; COPD in his father; Diabetes in his father; Heart murmur in his father; Hypertension in his father and mother; Rheumatic fever in his father.   ROS:   Please see the history  of present illness.    All other systems reviewed and are negative.   EKGs/Labs/Other Studies Reviewed:     The following studies were reviewed today:   December 17, 2020 echo Left ventricular function normal, 60% Right ventricular function normal Mild to moderate MR Mild dilation of the aortic root, 41 mm Left atrium is normal   December 23, 2020 transesophageal echo No left atrial appendage thrombus Mild to moderate MR   EKG:  The ekg ordered today demonstrates atrial fibrillation, left bundle branch block.     Recent Labs: 02/26/2021: Platelets 253 03/04/2021: BUN 15; Creatinine, Ser 1.20; Hemoglobin 13.6; Potassium 4.4; Sodium 140  Recent Lipid Panel Labs (Brief)  No results found for: CHOL, TRIG, HDL, CHOLHDL, VLDL, LDLCALC, LDLDIRECT     Physical Exam:     VS:  BP 130/84    Pulse 95    Ht 6\' 4"  (1.93 m)    Wt 285 lb (129.3 kg)    BMI 34.69 kg/m         Wt Readings from Last 3 Encounters:  03/12/21 285 lb (129.3 kg)  03/04/21 283 lb 6.4 oz (128.5 kg)  02/26/21 283 lb 6.4 oz (128.5 kg)      GEN:  Well nourished, well developed in no acute distress.  Obese. HEENT: Normal NECK: No JVD; No carotid bruits LYMPHATICS: No lymphadenopathy CARDIAC: Irregularly irregular.  2 out of 6 pansystolic murmur left sternal border.  No rubs or gallops RESPIRATORY:  Clear to auscultation without rales, wheezing or rhonchi  ABDOMEN: Soft, non-tender, non-distended MUSCULOSKELETAL:  No edema; No deformity  SKIN: Warm and dry NEUROLOGIC:  Alert and oriented x 3 PSYCHIATRIC:  Normal affect          Assessment     ASSESSMENT:     1. Persistent atrial fibrillation (HCC)   2. Atrial fibrillation, unspecified type (HCC)   3. Moderate mitral regurgitation   4. Dilation of aorta (HCC)   5. Primary hypertension   6. Obesity (BMI 30-39.9)     PLAN:     In order of problems listed above:   1. Persistent atrial fibrillation (HCC) 2. Atrial fibrillation, unspecified type [facility]) The  patient has symptomatic persistent atrial fibrillation.  He has had 2 prior cardioversions with improved symptoms when he was back in normal rhythm.  Unfortunately, each time he is gone back in atrial fibrillation.  A rhythm control strategy is indicated.  We discussed the rhythm control options for  him including antiarrhythmic drug therapy and catheter ablation.  He is not a candidate for class Ic agents because of his baseline conduction disease.  He would be a candidate for sotalol, Tikosyn or amiodarone.  He is too young in my opinion to initiate amiodarone.  I think he is an acceptable candidate for catheter ablation.   We discussed the catheter ablation procedure in detail including the risks, recovery and likelihood of success being in the 65 to 70% range at 1 year.  We discussed the possibility of needing antiarrhythmic drug therapy after an ablation.  We discussed the possibility of needing a repeat ablation procedure in the future.  He would like to proceed with scheduling.   Risk, benefits, and alternatives to EP study and radiofrequency ablation for afib were also discussed in detail today. These risks include but are not limited to stroke, bleeding, vascular damage, tamponade, perforation, damage to the esophagus, lungs, and other structures, pulmonary vein stenosis, worsening renal function, and death. The patient understands these risk and wishes to proceed.  We will therefore proceed with catheter ablation at the next available time.  Carto, ICE, anesthesia are requested for the procedure.  Will also obtain CT PV protocol prior to the procedure to exclude LAA thrombus and further evaluate atrial anatomy.     3. Moderate mitral regurgitation 4. Dilation of aorta (HCC) Followed by Dr. Shari Prows.   5. Primary hypertension At goal today.  Continue current medication regimen.  Recommend he check his blood pressure 1-2 times per week.  Weight loss encouraged.   6. Obesity (BMI 30-39.9) We  discussed the link between atrial fibrillation and obesity.  We discussed the link between obesity and atrial fibrillation ablation outcomes.  We discussed weight loss strategies during today's appointment.  He will work on losing at least 10 pounds prior to the procedure in an effort to maximize efficacy.       Total time spent with patient today 65 minutes. This includes reviewing records, evaluating the patient and coordinating care.      --------------------------------  I have seen, examined the patient, and reviewed the above assessment and plan.    Plan for PVI today.   Lanier Prude, MD 05/28/2021 12:02 PM

## 2021-05-29 DIAGNOSIS — I4819 Other persistent atrial fibrillation: Secondary | ICD-10-CM

## 2021-05-29 DIAGNOSIS — Z6834 Body mass index (BMI) 34.0-34.9, adult: Secondary | ICD-10-CM | POA: Diagnosis not present

## 2021-05-29 DIAGNOSIS — K589 Irritable bowel syndrome without diarrhea: Secondary | ICD-10-CM | POA: Diagnosis not present

## 2021-05-29 DIAGNOSIS — E669 Obesity, unspecified: Secondary | ICD-10-CM | POA: Diagnosis not present

## 2021-05-29 DIAGNOSIS — I447 Left bundle-branch block, unspecified: Secondary | ICD-10-CM | POA: Diagnosis not present

## 2021-05-29 DIAGNOSIS — Z7901 Long term (current) use of anticoagulants: Secondary | ICD-10-CM | POA: Diagnosis not present

## 2021-05-29 DIAGNOSIS — I340 Nonrheumatic mitral (valve) insufficiency: Secondary | ICD-10-CM | POA: Diagnosis not present

## 2021-05-29 DIAGNOSIS — I77819 Aortic ectasia, unspecified site: Secondary | ICD-10-CM | POA: Diagnosis not present

## 2021-05-29 DIAGNOSIS — I10 Essential (primary) hypertension: Secondary | ICD-10-CM | POA: Diagnosis not present

## 2021-05-29 MED ORDER — PANTOPRAZOLE SODIUM 40 MG PO TBEC: 40.0000 mg | DELAYED_RELEASE_TABLET | Freq: Every day | ORAL | 0 refills | Status: DC

## 2021-05-29 MED ORDER — COLCHICINE 0.6 MG PO TABS: 0.6000 mg | ORAL_TABLET | Freq: Two times a day (BID) | ORAL | 0 refills | Status: DC

## 2021-06-01 ENCOUNTER — Encounter (HOSPITAL_COMMUNITY): Payer: Self-pay | Admitting: Cardiology

## 2021-06-10 ENCOUNTER — Other Ambulatory Visit: Payer: Self-pay

## 2021-06-10 ENCOUNTER — Ambulatory Visit (HOSPITAL_BASED_OUTPATIENT_CLINIC_OR_DEPARTMENT_OTHER): Payer: BC Managed Care – PPO | Attending: Cardiology | Admitting: Cardiology

## 2021-06-10 VITALS — Ht 76.0 in | Wt 276.0 lb

## 2021-06-10 DIAGNOSIS — I493 Ventricular premature depolarization: Secondary | ICD-10-CM | POA: Diagnosis not present

## 2021-06-10 DIAGNOSIS — G4733 Obstructive sleep apnea (adult) (pediatric): Secondary | ICD-10-CM

## 2021-06-11 NOTE — Procedures (Signed)
° °  Patient Name: Jesse Rice, Jesse Rice Date: 06/10/2021 Gender: Male D.O.B: 1955-10-30 Age (years): 55 Referring Provider: Gwyndolyn Kaufman MD Height (inches): 40 Interpreting Physician: Fransico Him MD, ABSM Weight (lbs): 276 RPSGT: Baxter Flattery BMI: 35 MRN: [id number] Neck Size: 18.50  CLINICAL INFORMATION The patient is referred for a CPAP titration to treat sleep apnea.  SLEEP STUDY TECHNIQUE As per the AASM Manual for the Scoring of Sleep and Associated Events v2.3 (April 2016) with a hypopnea requiring 4% desaturations.  The channels recorded and monitored were frontal, central and occipital EEG, electrooculogram (EOG), submentalis EMG (chin), nasal and oral airflow, thoracic and abdominal wall motion, anterior tibialis EMG, snore microphone, electrocardiogram, and pulse oximetry. Continuous positive airway pressure (CPAP) was initiated at the beginning of the study and titrated to treat sleep-disordered breathing.  MEDICATIONS Medications self-administered by patient taken the night of the study : N/A  TECHNICIAN COMMENTS Comments added by technician: Patient had difficulty initiating sleep. Comments added by scorer: N/A  RESPIRATORY PARAMETERS Optimal PAP Pressure (cm):11  AHI at Optimal Pressure (/hr):0.5 Overall Minimal O2 (%):81.0  Supine % at Optimal Pressure (%):35 Minimal O2 at Optimal Pressure (%): 88.0   SLEEP ARCHITECTURE The study was initiated at 10:52:40 PM and ended at 4:54:08 AM.  Sleep onset time was 4.1 minutes and the sleep efficiency was 75.5%. The total sleep time was 273 minutes.  The patient spent 11.9% of the night in stage N1 sleep, 62.3% in stage N2 sleep, 0.0% in stage N3 and 25.8% in REM.Stage REM latency was 119.5 minutes  Wake after sleep onset was 84.3. Alpha intrusion was absent. Supine sleep was 51.61%.  CARDIAC DATA The 2 lead EKG demonstrated sinus rhythm. The mean heart rate was 57.6 beats per minute. Other EKG findings include:  PVCs.  LEG MOVEMENT DATA The total Periodic Limb Movements of Sleep (PLMS) were 0. The PLMS index was 0.0. A PLMS index of <15 is considered normal in adults.  IMPRESSIONS - The optimal PAP pressure was 11 cm of water. - Moderate oxygen desaturations were observed during this titration (min O2 = 81.0%). - No snoring was audible during this study. - No cardiac abnormalities were observed during this study. - Clinically significant periodic limb movements were not noted during this study. Arousals associated with PLMs were rare.  DIAGNOSIS - Obstructive Sleep Apnea (G47.33) - PVCs  RECOMMENDATIONS - Trial of CPAP therapy on 11 cm H2O with a Large size Fisher&Paykel Full Face Mask Simplus mask and heated humidification. - Avoid alcohol, sedatives and other CNS depressants that may worsen sleep apnea and disrupt normal sleep architecture. - Sleep hygiene should be reviewed to assess factors that may improve sleep quality. - Weight management and regular exercise should be initiated or continued. - Return to Sleep Center for re-evaluation after 6 weeks of therapy  [Electronically signed] 06/11/2021 03:14 PM  Fransico Him MD, ABSM Diplomate, American Board of Sleep Medicine

## 2021-06-28 ENCOUNTER — Other Ambulatory Visit (HOSPITAL_COMMUNITY): Payer: Self-pay

## 2021-06-29 ENCOUNTER — Other Ambulatory Visit: Payer: Self-pay

## 2021-06-29 ENCOUNTER — Encounter (HOSPITAL_COMMUNITY): Payer: Self-pay | Admitting: Physician Assistant

## 2021-06-29 ENCOUNTER — Ambulatory Visit (HOSPITAL_COMMUNITY)
Admission: RE | Admit: 2021-06-29 | Discharge: 2021-06-29 | Disposition: A | Discharge status: Home / Self Care | Payer: BC Managed Care – PPO | Source: Ambulatory Visit | Attending: Physician Assistant | Admitting: Physician Assistant

## 2021-06-29 VITALS — BP 136/84 | HR 70 | Ht 76.0 in | Wt 282.6 lb

## 2021-06-29 DIAGNOSIS — Z79899 Other long term (current) drug therapy: Secondary | ICD-10-CM | POA: Diagnosis not present

## 2021-06-29 DIAGNOSIS — I110 Hypertensive heart disease with heart failure: Secondary | ICD-10-CM | POA: Insufficient documentation

## 2021-06-29 DIAGNOSIS — I5032 Chronic diastolic (congestive) heart failure: Secondary | ICD-10-CM | POA: Insufficient documentation

## 2021-06-29 DIAGNOSIS — Z7901 Long term (current) use of anticoagulants: Secondary | ICD-10-CM | POA: Insufficient documentation

## 2021-06-29 DIAGNOSIS — G4733 Obstructive sleep apnea (adult) (pediatric): Secondary | ICD-10-CM | POA: Insufficient documentation

## 2021-06-29 DIAGNOSIS — I4819 Other persistent atrial fibrillation: Secondary | ICD-10-CM | POA: Insufficient documentation

## 2021-06-29 DIAGNOSIS — Z6834 Body mass index (BMI) 34.0-34.9, adult: Secondary | ICD-10-CM | POA: Diagnosis not present

## 2021-06-29 DIAGNOSIS — E669 Obesity, unspecified: Secondary | ICD-10-CM | POA: Diagnosis not present

## 2021-06-29 NOTE — Progress Notes (Signed)
Primary Care Physician: Soundra Pilon, FNP Primary Cardiologist: Dr Shari Prows  Primary Electrophysiologist: Dr Lalla Brothers Referring Physician: Dr Samuella Cota is a 66 y.o. male with a history of HTN, LBBB, dilated aortic root, MR, OSA, atrial fibrillation who presents for follow up in the [facility]. Patient is on Eliquis for a CHADS2VASC score of 1. Patient was seen by Dr Lalla Brothers on 03/12/21 and the plan was for afib ablation. However, patient had interim symptomatic afib and Dr Lalla Brothers recommended AAD to help maintain SR until ablation could be completed.   Patient is s/p dofetilide admission 11/28-12/1/22 with DCCV on 04/28/21 and is now s/p afib ablation 05/28/21 with Dr Lalla Brothers. He reports that he has done well since the procedure with no afib. He denies CP, swallowing pain, or groin issues.   Today, he denies symptoms of palpitations, chest pain, orthopnea, PND, lower extremity edema, dizziness, presyncope, syncope, snoring, daytime somnolence, bleeding, or neurologic sequela. The patient is tolerating medications without difficulties and is otherwise without complaint today.    Atrial Fibrillation Risk Factors:  he does have symptoms or diagnosis of sleep apnea. he is pending CPAP titration. he does not have a history of rheumatic fever.   he has a BMI of Body mass index is 34.4 kg/m.Marland Kitchen Filed Weights   06/29/21 1543  Weight: 128.2 kg     Family History  Problem Relation Age of Onset   Hypertension Mother    Arrhythmia Mother    Rheumatic fever Father    Heart murmur Father    Hypertension Father    COPD Father    Diabetes Father      Atrial Fibrillation Management history:  Previous antiarrhythmic drugs: dofetilide  Previous cardioversions: 12/23/20, 03/04/21 Previous ablations: 05/28/21 CHADS2VASC score: 1 Anticoagulation history: Eliquis   Past Medical History:  Diagnosis Date   Atrial fibrillation (HCC)    s/p  DCCV in 11/2020   Dilated aortic root (HCC)    Echo 7/22: 41 mm   Hypertension    Hypertensive pulmonary venous disease (HCC) 05/20/2014   IBS (irritable bowel syndrome)    LBBB (left bundle branch block)    rate related   Past Surgical History:  Procedure Laterality Date   ATRIAL FIBRILLATION ABLATION N/A 05/28/2021   Procedure: ATRIAL FIBRILLATION ABLATION;  Surgeon: Lanier Prude, MD;  Location: [facility] INVASIVE CV LAB;  Service: Cardiovascular;  Laterality: N/A;   CARDIOVERSION N/A 12/23/2020   Procedure: CARDIOVERSION;  Surgeon: Meriam Sprague, MD;  Location: [facility] ENDOSCOPY;  Service: Cardiovascular;  Laterality: N/A;   CARDIOVERSION N/A 03/04/2021   Procedure: CARDIOVERSION;  Surgeon: Thomasene Ripple, DO;  Location: [facility] ENDOSCOPY;  Service: Cardiovascular;  Laterality: N/A;   CARDIOVERSION N/A 04/28/2021   Procedure: CARDIOVERSION;  Surgeon: Meriam Sprague, MD;  Location: [facility] ENDOSCOPY;  Service: Cardiovascular;  Laterality: N/A;   CHOLECYSTECTOMY     TEE WITHOUT CARDIOVERSION N/A 12/23/2020   Procedure: TRANSESOPHAGEAL ECHOCARDIOGRAM (TEE);  Surgeon: Meriam Sprague, MD;  Location: [facility] ENDOSCOPY;  Service: Cardiovascular;  Laterality: N/A;    Current Outpatient Medications  Medication Sig Dispense Refill   apixaban (ELIQUIS) 5 MG TABS tablet Take 1 tablet (5 mg total) by mouth 2 (two) times daily. 180 tablet 2   atorvastatin (LIPITOR) 10 MG tablet Take 1 tablet (10 mg total) by mouth daily. 90 tablet 3   diphenoxylate-atropine (LOMOTIL) 2.5-0.025 MG tablet Take 1 tablet by mouth 4 (four) times daily as needed for diarrhea or  loose stools.     dofetilide (TIKOSYN) 500 MCG capsule Take 1 capsule (500 mcg total) by mouth 2 (two) times daily. 60 capsule 6   ferrous sulfate 325 (65 FE) MG tablet Take 1 tablet by mouth daily.     metoprolol succinate (TOPROL-XL) 50 MG 24 hr tablet Take 1 tablet (50 mg total) by mouth in the morning and at bedtime. Take with or immediately  following a meal. 180 tablet 3   colchicine 0.6 MG tablet Take 1 tablet (0.6 mg total) by mouth 2 (two) times daily for 5 days. 10 tablet 0   No current facility-administered medications for this encounter.    No Known Allergies  Social History   Socioeconomic History   Marital status: Single    Spouse name: Not on file   Number of children: Not on file   Years of education: Not on file   Highest education level: Not on file  Occupational History   Not on file  Tobacco Use   Smoking status: Former    Packs/day: 0.50    Types: Cigarettes   Smokeless tobacco: Never   Tobacco comments:    Former smoker 05/06/2021  Substance and Sexual Activity   Alcohol use: No   Drug use: No   Sexual activity: Not on file  Other Topics Concern   Not on file  Social History Narrative   Not on file   Social Determinants of Health   Financial Resource Strain: Not on file  Food Insecurity: Not on file  Transportation Needs: Not on file  Physical Activity: Not on file  Stress: Not on file  Social Connections: Not on file  Intimate Partner Violence: Not on file     ROS- All systems are reviewed and negative except as per the HPI above.  Physical Exam: Vitals:   06/29/21 1543  BP: 136/84  Pulse: 70  Weight: 128.2 kg  Height: 6\' 4"  (1.93 m)   GEN- The patient is a well appearing obese male, alert and oriented x 3 today.   HEENT-head normocephalic, atraumatic, sclera clear, conjunctiva pink, hearing intact, trachea midline. Lungs- Clear to ausculation bilaterally, normal work of breathing Heart- Regular rate and rhythm, no murmurs, rubs or gallops  GI- soft, NT, ND, + BS Extremities- no clubbing, cyanosis, or edema MS- no significant deformity or atrophy Skin- no rash or lesion Psych- euthymic mood, full affect Neuro- strength and sensation are intact   Wt Readings from Last 3 Encounters:  06/29/21 128.2 kg  06/10/21 125.2 kg  05/29/21 121.2 kg    EKG today demonstrates   SR, PAC Vent. rate 70 BPM PR interval 148 ms QRS duration 86 ms QT/QTcB 416/449 ms  Echo 12/17/20 demonstrated  1. Left ventricular ejection fraction, by estimation, is 60 to 65%. The  left ventricle has normal function. The left ventricle has no regional  wall motion abnormalities. Left ventricular diastolic function could not be evaluated.   2. Right ventricular systolic function is normal. The right ventricular  size is normal.   3. The mitral valve is normal in structure. Mild to moderate mitral valve regurgitation. No evidence of mitral stenosis.   4. The aortic valve is tricuspid. Aortic valve regurgitation is not  visualized. No aortic stenosis is present.   5. Aortic dilatation noted. There is mild dilatation of the aortic root,  measuring 41 mm.   6. The inferior vena cava is normal in size with greater than 50%  respiratory variability, suggesting right atrial pressure  of 3 mmHg.   Comparison(s): 05/27/14 EF 60-65%.   Epic records are reviewed at length today  CHA2DS2-VASc Score = 1  The patient's score is based upon: CHF History: 0 HTN History: 1 Diabetes History: 0 Stroke History: 0 Vascular Disease History: 0 Age Score: 0 Gender Score: 0   ASSESSMENT AND PLAN: 1. Persistent Atrial Fibrillation (ICD10:  I48.19) The patient's CHA2DS2-VASc score is 1, indicating a 0.6% annual risk of stroke.   S/p dofetilide admission 11/28-12/1/22 with DCCV on 04/28/21 Now s/p afib ablation 05/28/21 He appears to be maintaining SR. Continue dofetilide 500 mcg BID. QT stable. Continue Eliquis 5 mg BID Continue Toprol 50 mg BID  2. Obesity Body mass index is 34.4 kg/m. Lifestyle modification was discussed and encouraged including regular physical activity and weight reduction.  3. Obstructive sleep apnea Completed CPAP titration 06/10/21  4. Chronic diastolic CHF No signs or symptoms of fluid overload today.   Follow up with Dr Quentin Ore as scheduled.    [facility] [address] [place], [place] [address] [phone] 06/29/2021 4:15 PM

## 2021-06-30 ENCOUNTER — Telehealth: Payer: Self-pay | Admitting: *Deleted

## 2021-06-30 NOTE — Telephone Encounter (Signed)
-----   Message from Sueanne Margarita, MD sent at 06/11/2021  3:16 PM EST ----- Please let patient know that they had a successful PAP titration and let DME know that orders are in EPIC.  Please set up 6 week OV with me. Please get overnight pulse ox on CPAP

## 2021-06-30 NOTE — Telephone Encounter (Signed)
The patient has been notified of the result and verbalized understanding.  All questions (if any) were answered. Jesse Rice, [place] 06/30/2021 3:14 PM    Upon patient request DME selection is [facility] Patient understands he will be contacted by [facility] to set up his cpap. Patient understands to call if [facility] does not contact him with new setup in a timely manner. Patient understands they will be called once confirmation has been received from [employer]/ that they have received their new machine to schedule 10 week follow up appointment.   [facility] notified of new cpap order  Please add to airview Patient was grateful for the call and thanked me

## 2021-07-15 DIAGNOSIS — G4733 Obstructive sleep apnea (adult) (pediatric): Secondary | ICD-10-CM | POA: Diagnosis not present

## 2021-07-26 ENCOUNTER — Other Ambulatory Visit (HOSPITAL_COMMUNITY): Payer: Self-pay

## 2021-08-12 DIAGNOSIS — G4733 Obstructive sleep apnea (adult) (pediatric): Secondary | ICD-10-CM | POA: Diagnosis not present

## 2021-08-26 ENCOUNTER — Other Ambulatory Visit (HOSPITAL_COMMUNITY): Payer: Self-pay

## 2021-09-03 ENCOUNTER — Ambulatory Visit (INDEPENDENT_AMBULATORY_CARE_PROVIDER_SITE_OTHER): Payer: BC Managed Care – PPO | Admitting: Cardiology

## 2021-09-03 ENCOUNTER — Encounter: Payer: Self-pay | Admitting: Cardiology

## 2021-09-03 VITALS — BP 116/76 | HR 60 | Ht 76.0 in | Wt 287.2 lb

## 2021-09-03 DIAGNOSIS — I4819 Other persistent atrial fibrillation: Secondary | ICD-10-CM | POA: Diagnosis not present

## 2021-09-03 DIAGNOSIS — D649 Anemia, unspecified: Secondary | ICD-10-CM

## 2021-09-03 DIAGNOSIS — Z79899 Other long term (current) drug therapy: Secondary | ICD-10-CM

## 2021-09-03 NOTE — Patient Instructions (Signed)
Medication Instructions:  ?Your physician recommends that you continue on your current medications as directed. Please refer to the Current Medication list given to you today. ?*If you need a refill on your cardiac medications before your next appointment, please call your pharmacy* ? ?Lab Work: ?CBC, BMP, MAG ?If you have labs (blood work) drawn today and your tests are completely normal, you will receive your results only by: ?MyChart Message (if you have MyChart) OR ?A paper copy in the mail ?If you have any lab test that is abnormal or we need to change your treatment, we will call you to review the results. ? ?Testing/Procedures: ?None. ? ?Follow-Up: ?At [facility], you and your health needs are our priority.  As part of our continuing mission to provide you with exceptional heart care, we have created designated Provider Care Teams.  These Care Teams include your primary Cardiologist (physician) and Advanced Practice Providers (APPs -  Physician Assistants and Nurse Practitioners) who all work together to provide you with the care you need, when you need it. ? ?Your physician wants you to follow-up in: 4 months with one of the following Advanced Practice Providers on your designated Care Team:   ? ?Tommye Standard, PA-C ?Legrand Como "Jonni Sanger" [place], PA-C ?  You will receive a reminder letter in the mail two months in advance. If you don't receive a letter, please call our office to schedule the follow-up appointment. ? ?We recommend signing up for the patient portal called "MyChart".  Sign up information is provided on this After Visit Summary.  MyChart is used to connect with patients for Virtual Visits (Telemedicine).  Patients are able to view lab/test results, encounter notes, upcoming appointments, etc.  Non-urgent messages can be sent to your provider as well.   ?To learn more about what you can do with MyChart, go to [email or web address].   ? ?Any Other Special Instructions Will Be Listed Below (If  Applicable). ? ? ? ? ?  ? ? ?

## 2021-09-03 NOTE — Progress Notes (Signed)
?Electrophysiology Office Follow up Visit Note:   ? ?Date:  09/03/2021  ? ?ID:  Jesse Rice, DOB 1955/12/21, MRN [id number] ? ?PCP:  Kristen Loader, FNP  ?[facility] Cardiologist:  Freada Bergeron, MD  ?[facility] Electrophysiologist:  Vickie Epley, MD  ? ? ?Interval History:   ? ?Jesse Rice is a 66 y.o. male who presents for a follow up visit. They were last seen in clinic 04/06/2021. ?Since their last appointment, they underwent atrial fibrillation ablation on 05/28/2021. He followed up in cardiology most recently with Adline Peals, 06/29/2021. At that visit he was doing well since the ablation with no recurrent Afib. EKG showed sinus rhythm at 70 bpm, with PAC. Continued on dofetilide 500 mcg BID, Eliquis 5 mg BID, and Toprol 50 mg BID. Also, he had completed CPAP titration on 06/10/2021. ? ?Overall, he is feeling pretty good. He uses the Chad occasionally which has not detected any arrhythmia. However, he did have an episode of feeling fatigued, and he needed to pace himself. ? ?Previously he had stopped his OTC iron supplements. He plans to restart this. ? ?He denies any chest pain, shortness of breath, or peripheral edema. No lightheadedness, headaches, syncope, orthopnea, or PND. ? ? ?  ? ?Past Medical History:  ?Diagnosis Date  ? Atrial fibrillation ([place])   ? s/p DCCV in 11/2020  ? Dilated aortic root ([place])   ? Echo 7/22: 41 mm  ? Hypertension   ? Hypertensive pulmonary venous disease ([place]) 05/20/2014  ? IBS (irritable bowel syndrome)   ? LBBB (left bundle branch block)   ? rate related  ? ? ?Past Surgical History:  ?Procedure Laterality Date  ? ATRIAL FIBRILLATION ABLATION N/A 05/28/2021  ? Procedure: ATRIAL FIBRILLATION ABLATION;  Surgeon: Vickie Epley, MD;  Location: [place] [facility];  Service: Cardiovascular;  Laterality: N/A;  ? CARDIOVERSION N/A 12/23/2020  ? Procedure: CARDIOVERSION;  Surgeon: Freada Bergeron, MD;  Location: [facility] ENDOSCOPY;  Service: Cardiovascular;   Laterality: N/A;  ? CARDIOVERSION N/A 03/04/2021  ? Procedure: CARDIOVERSION;  Surgeon: Berniece Salines, DO;  Location: [place];  Service: Cardiovascular;  Laterality: N/A;  ? CARDIOVERSION N/A 04/28/2021  ? Procedure: CARDIOVERSION;  Surgeon: Freada Bergeron, MD;  Location: [facility] ENDOSCOPY;  Service: Cardiovascular;  Laterality: N/A;  ? CHOLECYSTECTOMY    ? TEE WITHOUT CARDIOVERSION N/A 12/23/2020  ? Procedure: TRANSESOPHAGEAL ECHOCARDIOGRAM (TEE);  Surgeon: Freada Bergeron, MD;  Location: [place];  Service: Cardiovascular;  Laterality: N/A;  ? ? ?Current Medications: ?Current Meds  ?Medication Sig  ? apixaban (ELIQUIS) 5 MG TABS tablet Take 1 tablet (5 mg total) by mouth 2 (two) times daily.  ? diphenoxylate-atropine (LOMOTIL) 2.5-0.025 MG tablet Take 1 tablet by mouth 4 (four) times daily as needed for diarrhea or loose stools.  ? dofetilide (TIKOSYN) 500 MCG capsule Take 1 capsule (500 mcg total) by mouth 2 (two) times daily.  ? metoprolol succinate (TOPROL-XL) 50 MG 24 hr tablet Take 1 tablet (50 mg total) by mouth in the morning and at bedtime. Take with or immediately following a meal.  ?  ? ?Allergies:   Patient has no allergy information on record.  ? ?Social History  ? ?Socioeconomic History  ? Marital status: Single  ?  Spouse name: Not on file  ? Number of children: Not on file  ? Years of education: Not on file  ? Highest education level: Not on file  ?Occupational History  ? Not on file  ?  Tobacco Use  ? Smoking status: Former  ?  Packs/day: 0.50  ?  Types: Cigarettes  ? Smokeless tobacco: Never  ? Tobacco comments:  ?  Former smoker 05/06/2021  ?Substance and Sexual Activity  ? Alcohol use: No  ? Drug use: No  ? Sexual activity: Not on file  ?Other Topics Concern  ? Not on file  ?Social History Narrative  ? Not on file  ? ?Social Determinants of Health  ? ?Financial Resource Strain: Not on file  ?Food Insecurity: Not on file  ?Transportation Needs: Not on file  ?Physical Activity: Not on  file  ?Stress: Not on file  ?Social Connections: Not on file  ?  ? ?Family History: ?The patient's family history includes Arrhythmia in his mother; COPD in his father; Diabetes in his father; Heart murmur in his father; Hypertension in his father and mother; Rheumatic fever in his father. ? ?ROS:   ?Please see the history of present illness.    ?All other systems reviewed and are negative. ? ?EKGs/Labs/Other Studies Reviewed:   ? ?The following studies were reviewed today: ? ?Atrial Fibrillation Ablation 05/28/2021: ?CONCLUSIONS: ?1. Successful PVI ?2. Successful ablation/isolation of the posterior wall ?3. Intracardiac echo reveals trivial to small pericardial effusion, left-sided common ostium, normal left ventricular function ?4. No early apparent complications. ?5.  Colchicine 0.6 mg by mouth twice daily for 5 days ?6.  Protonix 40 mg by mouth once daily for 45 days ? ?Cardiac CTA 05/20/2021: ?FINDINGS: ?A 120 kV prospective scan was triggered in the descending thoracic ?aorta at 111 HU's. Gantry rotation speed was 280 msecs and ?collimation was .9 mm. No beta blockade and no NTG was given. The 3D ?data set was reconstructed in 5% intervals of the R-R cycle. Phases ?were analyzed on a dedicated work station using MPR, MIP and VRT ?modes. The patient received 80 cc of contrast. ?  ?Left atrium: Mild enlargement No thrombus in the left atrium or left ?atrial appendage. ?  ?Left ventricle: Normal in size. ?  ?Right atrium: Mild enlargement ?  ?Right ventricle:  Mild enlargement ?  ?Pericardium: Normal thickness ?  ?Pulmonary veins: ?  ?Common ostium left pulmonary veins: Measures 3.2cm x 2.6cm in ?diameter with area 6.3 cm^2. ?  ?Right superior pulmonary vein: Measures 2.3cm x 2.2cm in diameter ?with area 3.9 cm^2. ?  ?Right inferior pulmonary vein: Measures 2.8cm x 2.5cm in diameter ?with area 5.3 cm^2. ?  ?Pulmonary arteries: Normal size ?  ?Coronary Arteries: Normal coronary origin. Right dominance. The ?study  was performed without use of NTG and insufficient for plaque ?evaluation. Calcium score 74 (55th percentile) ?  ?Esophagus: Courses posterior to ostium of left sided pulmonary veins ?  ?IMPRESSION: ?1. There is normal pulmonary vein drainage into the left atrium. ?Common ostium left sided pulmonary veins. Measurements as reported ?  ?2.  There is no thrombus in the left atrial appendage. ?  ?3. Esophagus courses posterior to ostium of left sided pulmonary ?veins ?  ?4.  Coronary calcium score 74 (55th percentile) ? ?Echo TEE 12/23/2020: ? 1. Left ventricular ejection fraction, by estimation, is 60 to 65%. The  ?left ventricle has normal function.  ? 2. Right ventricular systolic function is normal. The right ventricular  ?size is normal.  ? 3. No left atrial/left atrial appendage thrombus was detected.  ? 4. The mitral valve is normal in structure. There is mild-to-moderate vs  ?moderate mitral regurgitation with multiple jets seen along the coaptation  ?line. Degree  of MR varied with blood pressure during the study. No  ?evidence of mitral stenosis.  ? 5. The aortic valve is tricuspid. There is mild thickening of the aortic  ?valve. Aortic valve regurgitation is not visualized. Mild aortic valve  ?sclerosis is present, with no evidence of aortic valve stenosis.  ? 6. There is mild (Grade II) plaque.  ? ?Echo 12/17/2020: ?Sonographer Comments: Suboptimal apical window, Technically difficult  ?study due to poor echo windows and suboptimal parasternal window. Image acquisition challenging due to patient body habitus.  ?IMPRESSIONS  ? ? 1. Left ventricular ejection fraction, by estimation, is 60 to 65%. The  ?left ventricle has normal function. The left ventricle has no regional  ?wall motion abnormalities. Left ventricular diastolic function could not  ?be evaluated.  ? 2. Right ventricular systolic function is normal. The right ventricular  ?size is normal.  ? 3. The mitral valve is normal in structure. Mild to  moderate mitral valve  ?regurgitation. No evidence of mitral stenosis.  ? 4. The aortic valve is tricuspid. Aortic valve regurgitation is not  ?visualized. No aortic stenosis is present.  ? 5. Aortic dilatation

## 2021-09-04 LAB — BASIC METABOLIC PANEL
BUN/Creatinine Ratio: 12 (ref 10–24)
BUN: 16 mg/dL (ref 8–27)
CO2: 23 mmol/L (ref 20–29)
Calcium: 9.2 mg/dL (ref 8.6–10.2)
Chloride: 104 mmol/L (ref 96–106)
Creatinine, Ser: 1.36 mg/dL — ABNORMAL HIGH (ref 0.76–1.27)
Glucose: 104 mg/dL — ABNORMAL HIGH (ref 70–99)
Potassium: 4.5 mmol/L (ref 3.5–5.2)
Sodium: 141 mmol/L (ref 134–144)
eGFR: 58 mL/min/{1.73_m2} — ABNORMAL LOW (ref >59)

## 2021-09-04 LAB — CBC WITH DIFFERENTIAL/PLATELET
Basophils Absolute: 0.1 10*3/uL (ref 0.0–0.2)
Basos: 1 %
EOS (ABSOLUTE): 0.2 10*3/uL (ref 0.0–0.4)
Eos: 2 %
Hematocrit: 38 % (ref 37.5–51.0)
Hemoglobin: 13 g/dL (ref 13.0–17.7)
Immature Grans (Abs): 0 10*3/uL (ref 0.0–0.1)
Immature Granulocytes: 0 %
Lymphocytes Absolute: 2.8 10*3/uL (ref 0.7–3.1)
Lymphs: 29 %
MCH: 30.3 pg (ref 26.6–33.0)
MCHC: 34.2 g/dL (ref 31.5–35.7)
MCV: 89 fL (ref 79–97)
Monocytes Absolute: 1 10*3/uL — ABNORMAL HIGH (ref 0.1–0.9)
Monocytes: 10 %
Neutrophils Absolute: 5.5 10*3/uL (ref 1.4–7.0)
Neutrophils: 58 %
Platelets: 231 10*3/uL (ref 150–450)
RBC: 4.29 x10E6/uL (ref 4.14–5.80)
RDW: 12.3 % (ref 11.6–15.4)
WBC: 9.6 10*3/uL (ref 3.4–10.8)

## 2021-09-04 LAB — MAGNESIUM: Magnesium: 1.9 mg/dL (ref 1.6–2.3)

## 2021-09-12 DIAGNOSIS — G4733 Obstructive sleep apnea (adult) (pediatric): Secondary | ICD-10-CM | POA: Diagnosis not present

## 2021-09-21 ENCOUNTER — Other Ambulatory Visit: Payer: Self-pay | Admitting: Pharmacist

## 2021-09-21 DIAGNOSIS — G4733 Obstructive sleep apnea (adult) (pediatric): Secondary | ICD-10-CM

## 2021-09-21 DIAGNOSIS — I10 Essential (primary) hypertension: Secondary | ICD-10-CM

## 2021-09-21 DIAGNOSIS — I4891 Unspecified atrial fibrillation: Secondary | ICD-10-CM

## 2021-09-21 MED ORDER — APIXABAN 5 MG PO TABS: 5.0000 mg | ORAL_TABLET | Freq: Two times a day (BID) | ORAL | 1 refills | Status: DC

## 2021-09-28 ENCOUNTER — Other Ambulatory Visit (HOSPITAL_COMMUNITY): Payer: Self-pay

## 2021-09-29 ENCOUNTER — Other Ambulatory Visit (HOSPITAL_COMMUNITY): Payer: Self-pay

## 2021-10-12 DIAGNOSIS — G4733 Obstructive sleep apnea (adult) (pediatric): Secondary | ICD-10-CM | POA: Diagnosis not present

## 2021-10-26 ENCOUNTER — Other Ambulatory Visit (HOSPITAL_COMMUNITY): Payer: Self-pay

## 2021-11-12 ENCOUNTER — Telehealth: Payer: Self-pay | Admitting: Cardiology

## 2021-11-12 ENCOUNTER — Other Ambulatory Visit: Payer: Self-pay

## 2021-11-12 ENCOUNTER — Other Ambulatory Visit: Payer: Self-pay | Admitting: *Deleted

## 2021-11-12 DIAGNOSIS — G4733 Obstructive sleep apnea (adult) (pediatric): Secondary | ICD-10-CM | POA: Diagnosis not present

## 2021-11-12 MED ORDER — METOPROLOL SUCCINATE ER 50 MG PO TB24: 50.0000 mg | ORAL_TABLET | Freq: Two times a day (BID) | ORAL | 11 refills | Status: DC

## 2021-11-12 MED ORDER — METOPROLOL SUCCINATE ER 50 MG PO TB24
50.0000 mg | ORAL_TABLET | Freq: Two times a day (BID) | ORAL | 0 refills | Status: DC
Start: 2021-11-12 — End: 2022-01-18

## 2021-11-12 NOTE — Telephone Encounter (Signed)
*  STAT* If patient is at the pharmacy, call can be transferred to refill team.   1. Which medications need to be refilled? (please list name of each medication and dose if known)   metoprolol succinate (TOPROL-XL) 50 MG 24 hr tablet     2. Which pharmacy/location (including street and city if local pharmacy) is medication to be sent to? [facility], [address], [place] [address]  3. Do they need a 30 day or 90 day supply? 2 weeks   Pt states that his mail pharmacy was not able to send his medication until Monday. He needs an emergency refill today because he completely out.

## 2021-11-12 NOTE — Progress Notes (Signed)
Rx for 2 week supply to [facility] as requested.

## 2021-11-26 ENCOUNTER — Other Ambulatory Visit (HOSPITAL_COMMUNITY): Payer: Self-pay

## 2021-12-12 DIAGNOSIS — G4733 Obstructive sleep apnea (adult) (pediatric): Secondary | ICD-10-CM | POA: Diagnosis not present

## 2021-12-14 ENCOUNTER — Other Ambulatory Visit: Payer: BC Managed Care – PPO

## 2021-12-14 DIAGNOSIS — I77819 Aortic ectasia, unspecified site: Secondary | ICD-10-CM

## 2021-12-14 DIAGNOSIS — Z0189 Encounter for other specified special examinations: Secondary | ICD-10-CM

## 2021-12-15 LAB — BASIC METABOLIC PANEL
BUN/Creatinine Ratio: 11 (ref 10–24)
BUN: 14 mg/dL (ref 8–27)
CO2: 25 mmol/L (ref 20–29)
Calcium: 9.6 mg/dL (ref 8.6–10.2)
Chloride: 103 mmol/L (ref 96–106)
Creatinine, Ser: 1.29 mg/dL — ABNORMAL HIGH (ref 0.76–1.27)
Glucose: 102 mg/dL — ABNORMAL HIGH (ref 70–99)
Potassium: 4.8 mmol/L (ref 3.5–5.2)
Sodium: 141 mmol/L (ref 134–144)
eGFR: 62 mL/min/{1.73_m2} (ref >59)

## 2021-12-21 ENCOUNTER — Ambulatory Visit (HOSPITAL_COMMUNITY)
Admission: RE | Admit: 2021-12-21 | Discharge: 2021-12-21 | Disposition: A | Discharge status: Home / Self Care | Payer: BC Managed Care – PPO | Source: Ambulatory Visit | Attending: Cardiology | Admitting: Cardiology

## 2021-12-21 ENCOUNTER — Inpatient Hospital Stay: Admission: RE | Admit: 2021-12-21 | Payer: BC Managed Care – PPO | Source: Ambulatory Visit

## 2021-12-21 DIAGNOSIS — I7789 Other specified disorders of arteries and arterioles: Secondary | ICD-10-CM | POA: Diagnosis not present

## 2021-12-21 DIAGNOSIS — I77819 Aortic ectasia, unspecified site: Secondary | ICD-10-CM | POA: Diagnosis not present

## 2021-12-21 MED ORDER — IOHEXOL 350 MG/ML SOLN
100.0000 mL | Freq: Once | INTRAVENOUS | Status: AC | PRN
  Administered 2021-12-21: 100 mL via INTRAVENOUS

## 2021-12-27 ENCOUNTER — Other Ambulatory Visit (HOSPITAL_COMMUNITY): Payer: Self-pay

## 2021-12-27 ENCOUNTER — Other Ambulatory Visit (HOSPITAL_COMMUNITY): Payer: Self-pay | Admitting: Physician Assistant

## 2021-12-28 ENCOUNTER — Other Ambulatory Visit (HOSPITAL_COMMUNITY): Payer: Self-pay

## 2021-12-28 MED ORDER — DOFETILIDE 500 MCG PO CAPS
500.0000 ug | ORAL_CAPSULE | Freq: Two times a day (BID) | ORAL | 2 refills | Status: DC
  Filled 2021-12-28: qty 60, 30d supply, fill #0
  Filled 2022-01-24: qty 60, 30d supply, fill #1
  Filled 2022-02-23: qty 60, 30d supply, fill #2
  Filled 2022-03-24: qty 60, 30d supply, fill #3
  Filled 2022-04-26: qty 60, 30d supply, fill #4
  Filled 2022-05-27: qty 60, 30d supply, fill #5

## 2022-01-02 NOTE — Progress Notes (Signed)
Cardiology Office Note Date:  01/04/2022  Patient ID:  Jesse Rice 02/04/1956, MRN [id number] PCP:  Soundra Pilon, FNP  Cardiologist:  Dr. Shari Prows Electrophysiologist: Dr. Lalla Brothers     Chief Complaint:  4 mo  History of Present Illness: Jesse Rice is a 66 y.o. male with history of HTN, rate related LBBB, Afib, dilated Ao root.  He las saw cardiology team by Wende Mott, PA, 02/26/21, was feeling well post DCCV.  He was back in AFib and unaware, referred to EP for rhythm management options. Planned for CT in 2023 to monitor his Aorta and an echo in a couple years to follow his MR. Pending sleep study  He comes today to be seen for Dr. Lalla Brothers, last seen by him 09/03/21, he would check his rhythm occasionally noting no AFib.  Did have some episode of fatigue/reduced exertional capacity with plans to resume his iron supplements Was maintaining SR, planned to continue dofetilide at least a year post ablation     Afib/AAD hx Diagnosed June 2022 TEE/DCCV 12/23/20 Tikosyn started Nov 2022 PVI ablation 05/28/21   Past Medical History:  Diagnosis Date   Atrial fibrillation ([facility])    s/p DCCV in 11/2020   Dilated aortic root ([facility])    Echo 7/22: 41 mm   Hypertension    Hypertensive pulmonary venous disease ([facility]) 05/20/2014   IBS (irritable bowel syndrome)    LBBB (left bundle branch block)    rate related    Past Surgical History:  Procedure Laterality Date   ATRIAL FIBRILLATION ABLATION N/A 05/28/2021   Procedure: ATRIAL FIBRILLATION ABLATION;  Surgeon: Lanier Prude, MD;  Location: [facility];  Service: Cardiovascular;  Laterality: N/A;   CARDIOVERSION N/A 12/23/2020   Procedure: CARDIOVERSION;  Surgeon: Meriam Sprague, MD;  Location: [facility] ENDOSCOPY;  Service: Cardiovascular;  Laterality: N/A;   CARDIOVERSION N/A 03/04/2021   Procedure: CARDIOVERSION;  Surgeon: Thomasene Ripple, DO;  Location: [facility] ENDOSCOPY;  Service: Cardiovascular;  Laterality: N/A;    CARDIOVERSION N/A 04/28/2021   Procedure: CARDIOVERSION;  Surgeon: Meriam Sprague, MD;  Location: [facility] ENDOSCOPY;  Service: Cardiovascular;  Laterality: N/A;   CHOLECYSTECTOMY     TEE WITHOUT CARDIOVERSION N/A 12/23/2020   Procedure: TRANSESOPHAGEAL ECHOCARDIOGRAM (TEE);  Surgeon: Meriam Sprague, MD;  Location: [facility] ENDOSCOPY;  Service: Cardiovascular;  Laterality: N/A;    Current Outpatient Medications  Medication Sig Dispense Refill   apixaban (ELIQUIS) 5 MG TABS tablet Take 1 tablet (5 mg total) by mouth 2 (two) times daily. 180 tablet 1   atorvastatin (LIPITOR) 10 MG tablet Take 1 tablet (10 mg total) by mouth daily. 90 tablet 3   diphenoxylate-atropine (LOMOTIL) 2.5-0.025 MG tablet Take 1 tablet by mouth 4 (four) times daily as needed for diarrhea or loose stools.     dofetilide (TIKOSYN) 500 MCG capsule Take 1 capsule (500 mcg total) by mouth 2 (two) times daily. 180 capsule 2   ferrous sulfate 325 (65 FE) MG tablet Take 1 tablet by mouth daily.     metoprolol succinate (TOPROL-XL) 50 MG 24 hr tablet Take 1 tablet (50 mg total) by mouth in the morning and at bedtime. Take with or immediately following a meal. 30 tablet 0   No current facility-administered medications for this visit.    Allergies:   Patient has no allergy information on record.   Social History:  The patient  reports that he has quit smoking. His smoking use included cigarettes. He smoked an average  of .5 packs per day. He has never used smokeless tobacco. He reports that he does not drink alcohol and does not use drugs.   Family History:  The patient's family history includes Arrhythmia in his mother; COPD in his father; Diabetes in his father; Heart murmur in his father; Hypertension in his father and mother; Rheumatic fever in his father.  ROS:  Please see the history of present illness.    All other systems are reviewed and otherwise negative.   PHYSICAL EXAM:  VS:  BP 122/78   Pulse 63   Ht 6\' 4"  (1.93  m)   Wt 287 lb 3.2 oz (130.3 kg)   SpO2 97%   BMI 34.96 kg/m  BMI: Body mass index is 34.96 kg/m. Well nourished, well developed, in no acute distress HEENT: normocephalic, atraumatic Neck: no JVD, carotid bruits or masses Cardiac:  RRR; no significant murmurs, no rubs, or gallops Lungs:  CTA b/l, no wheezing, rhonchi or rales Abd: soft, nontender MS: no deformity or atrophy Ext: no edema Skin: warm and dry, no rash Neuro:  No gross deficits appreciated Psych: euthymic mood, full affect   EKG:  Done today and reviewed by myself shows  SR 63, QTc 446    12/21/21: CT angio chest IMPRESSION: 1. No thoracic aorta abnormality. 2. No acute intrathoracic abnormality.  05/18/2021: EPS/ablation CONCLUSIONS: 1. Successful PVI 2. Successful ablation/isolation of the posterior wall 3. Intracardiac echo reveals trivial to small pericardial effusion, left-sided common ostium, normal left ventricular function 4. No early apparent complications. 5.  Colchicine 0.6 mg by mouth twice daily for 5 days 6.  Protonix 40 mg by mouth once daily for 45 days  05/20/21: Cardiac CT IMPRESSION: 1. There is normal pulmonary vein drainage into the left atrium. Common ostium left sided pulmonary veins. Measurements as reported 2.  There is no thrombus in the left atrial appendage. 3. Esophagus courses posterior to ostium of left sided pulmonary veins 4.  Coronary calcium score 74 (55th percentile)    12/23/2020: TEE  1. Left ventricular ejection fraction, by estimation, is 60 to 65%. The  left ventricle has normal function.   2. Right ventricular systolic function is normal. The right ventricular  size is normal.   3. No left atrial/left atrial appendage thrombus was detected.   4. The mitral valve is normal in structure. There is mild-to-moderate vs  moderate mitral regurgitation with multiple jets seen along the coaptation  line. Degree of MR varied with blood pressure during the study. No   evidence of mitral stenosis.   5. The aortic valve is tricuspid. There is mild thickening of the aortic  valve. Aortic valve regurgitation is not visualized. Mild aortic valve  sclerosis is present, with no evidence of aortic valve stenosis.   6. There is mild (Grade II) plaque.    TEE 12/23/20 EF 60-65, normal RVSF, no LAA clot, mild to mod MR, AV sclerosis   Echocardiogram 12/17/20 EF 60-65, no RWMA, normal RVSF, mild to mod MR, Ao root 41 mm  Recent Labs: 09/03/2021: Hemoglobin 13.0; Magnesium 1.9; Platelets 231 12/14/2021: BUN 14; Creatinine, Ser 1.29; Potassium 4.8; Sodium 141  No results found for requested labs within last 365 days.   CrCl cannot be calculated (Patient's most recent lab result is older than the maximum 21 days allowed.).   Wt Readings from Last 3 Encounters:  01/04/22 287 lb 3.2 oz (130.3 kg)  09/03/21 287 lb 3.2 oz (130.3 kg)  06/29/21 282 lb 9.6 oz (  128.2 kg)     Other studies reviewed: Additional studies/records reviewed today include: summarized above  ASSESSMENT AND PLAN:  Persistent Afib CHA2DS2Vasc is one, on Eliquis, appropriately dosed Post ablation Tikosyn with stable QTc Low/no burden by symptoms/kardia monitoring Labs today  Tikosyn teaching re-enforced   HTN Looks great    Disposition: F/u with Dr. Lalla Brothers Dec/Jan time, revisit his AAD a his 1 year mark post ablation, sooner if needed   Current medicines are reviewed at length with the patient today.  The patient did not have any concerns regarding medicines.  Norma Fredrickson, PA-C 01/04/2022 3:54 PM     [facility] [address] [place] [address] [phone] (office)  [phone] (fax)

## 2022-01-04 ENCOUNTER — Encounter: Payer: Self-pay | Admitting: Physician Assistant

## 2022-01-04 ENCOUNTER — Ambulatory Visit (INDEPENDENT_AMBULATORY_CARE_PROVIDER_SITE_OTHER): Payer: BC Managed Care – PPO | Admitting: Physician Assistant

## 2022-01-04 VITALS — BP 122/78 | HR 63 | Ht 76.0 in | Wt 287.2 lb

## 2022-01-04 DIAGNOSIS — I10 Essential (primary) hypertension: Secondary | ICD-10-CM | POA: Diagnosis not present

## 2022-01-04 DIAGNOSIS — Z5181 Encounter for therapeutic drug level monitoring: Secondary | ICD-10-CM | POA: Diagnosis not present

## 2022-01-04 DIAGNOSIS — Z79899 Other long term (current) drug therapy: Secondary | ICD-10-CM

## 2022-01-04 DIAGNOSIS — I4819 Other persistent atrial fibrillation: Secondary | ICD-10-CM

## 2022-01-04 NOTE — Patient Instructions (Signed)
Medication Instructions:  none *If you need a refill on your cardiac medications before your next appointment, please call your pharmacy*   Lab Work: Bmet, Mag, CBC If you have labs (blood work) drawn today and your tests are completely normal, you will receive your results only by: MyChart Message (if you have MyChart) OR A paper copy in the mail If you have any lab test that is abnormal or we need to change your treatment, we will call you to review the results.   Testing/Procedures: none   Follow-Up: At [facility], you and your health needs are our priority.  As part of our continuing mission to provide you with exceptional heart care, we have created designated Provider Care Teams.  These Care Teams include your primary Cardiologist (physician) and Advanced Practice Providers (APPs -  Physician Assistants and Nurse Practitioners) who all work together to provide you with the care you need, when you need it.  We recommend signing up for the patient portal called "MyChart".  Sign up information is provided on this After Visit Summary.  MyChart is used to connect with patients for Virtual Visits (Telemedicine).  Patients are able to view lab/test results, encounter notes, upcoming appointments, etc.  Non-urgent messages can be sent to your provider as well.   To learn more about what you can do with MyChart, go to [email or web address].    Your next appointment:   4 months  The format for your next appointment:   In Person  Provider:   Steffanie Dunn, MD{  Important Information About Sugar

## 2022-01-05 LAB — BASIC METABOLIC PANEL
BUN/Creatinine Ratio: 8 — ABNORMAL LOW (ref 10–24)
BUN: 11 mg/dL (ref 8–27)
CO2: 24 mmol/L (ref 20–29)
Calcium: 9.7 mg/dL (ref 8.6–10.2)
Chloride: 102 mmol/L (ref 96–106)
Creatinine, Ser: 1.34 mg/dL — ABNORMAL HIGH (ref 0.76–1.27)
Glucose: 98 mg/dL (ref 70–99)
Potassium: 4.6 mmol/L (ref 3.5–5.2)
Sodium: 141 mmol/L (ref 134–144)
eGFR: 60 mL/min/{1.73_m2} (ref >59)

## 2022-01-05 LAB — CBC
Hematocrit: 40.7 % (ref 37.5–51.0)
Hemoglobin: 13.7 g/dL (ref 13.0–17.7)
MCH: 30.4 pg (ref 26.6–33.0)
MCHC: 33.7 g/dL (ref 31.5–35.7)
MCV: 90 fL (ref 79–97)
Platelets: 257 10*3/uL (ref 150–450)
RBC: 4.5 x10E6/uL (ref 4.14–5.80)
RDW: 12.1 % (ref 11.6–15.4)
WBC: 11 10*3/uL — ABNORMAL HIGH (ref 3.4–10.8)

## 2022-01-05 LAB — MAGNESIUM: Magnesium: 1.9 mg/dL (ref 1.6–2.3)

## 2022-01-11 DIAGNOSIS — G4733 Obstructive sleep apnea (adult) (pediatric): Secondary | ICD-10-CM | POA: Diagnosis not present

## 2022-01-12 DIAGNOSIS — G4733 Obstructive sleep apnea (adult) (pediatric): Secondary | ICD-10-CM | POA: Diagnosis not present

## 2022-01-18 ENCOUNTER — Other Ambulatory Visit: Payer: Self-pay

## 2022-01-18 MED ORDER — METOPROLOL SUCCINATE ER 50 MG PO TB24: 50.0000 mg | ORAL_TABLET | Freq: Two times a day (BID) | ORAL | 3 refills | Status: DC

## 2022-01-24 ENCOUNTER — Other Ambulatory Visit (HOSPITAL_COMMUNITY): Payer: Self-pay

## 2022-02-11 DIAGNOSIS — G4733 Obstructive sleep apnea (adult) (pediatric): Secondary | ICD-10-CM | POA: Diagnosis not present

## 2022-02-12 DIAGNOSIS — G4733 Obstructive sleep apnea (adult) (pediatric): Secondary | ICD-10-CM | POA: Diagnosis not present

## 2022-02-16 DIAGNOSIS — Z125 Encounter for screening for malignant neoplasm of prostate: Secondary | ICD-10-CM | POA: Diagnosis not present

## 2022-02-16 DIAGNOSIS — I10 Essential (primary) hypertension: Secondary | ICD-10-CM | POA: Diagnosis not present

## 2022-02-16 DIAGNOSIS — E782 Mixed hyperlipidemia: Secondary | ICD-10-CM | POA: Diagnosis not present

## 2022-02-16 DIAGNOSIS — N183 Chronic kidney disease, stage 3 unspecified: Secondary | ICD-10-CM | POA: Diagnosis not present

## 2022-02-23 ENCOUNTER — Other Ambulatory Visit (HOSPITAL_COMMUNITY): Payer: Self-pay

## 2022-03-13 DIAGNOSIS — G4733 Obstructive sleep apnea (adult) (pediatric): Secondary | ICD-10-CM | POA: Diagnosis not present

## 2022-03-14 DIAGNOSIS — G4733 Obstructive sleep apnea (adult) (pediatric): Secondary | ICD-10-CM | POA: Diagnosis not present

## 2022-03-19 ENCOUNTER — Other Ambulatory Visit: Payer: Self-pay | Admitting: Cardiology

## 2022-03-19 DIAGNOSIS — I4891 Unspecified atrial fibrillation: Secondary | ICD-10-CM

## 2022-03-19 DIAGNOSIS — G4733 Obstructive sleep apnea (adult) (pediatric): Secondary | ICD-10-CM

## 2022-03-19 DIAGNOSIS — I10 Essential (primary) hypertension: Secondary | ICD-10-CM

## 2022-03-21 NOTE — Telephone Encounter (Signed)
Prescription refill request for Eliquis received. Indication:Afib Last office visit:8/23 Scr:1.3 Age: 66 Weight:130.3 kg  Prescription refilled

## 2022-03-24 ENCOUNTER — Other Ambulatory Visit (HOSPITAL_COMMUNITY): Payer: Self-pay

## 2022-04-12 DIAGNOSIS — G4733 Obstructive sleep apnea (adult) (pediatric): Secondary | ICD-10-CM | POA: Diagnosis not present

## 2022-04-14 DIAGNOSIS — G4733 Obstructive sleep apnea (adult) (pediatric): Secondary | ICD-10-CM | POA: Diagnosis not present

## 2022-04-26 ENCOUNTER — Other Ambulatory Visit (HOSPITAL_COMMUNITY): Payer: Self-pay

## 2022-04-28 ENCOUNTER — Other Ambulatory Visit (HOSPITAL_COMMUNITY): Payer: Self-pay

## 2022-05-11 ENCOUNTER — Encounter: Payer: Self-pay | Admitting: Cardiology

## 2022-05-11 ENCOUNTER — Ambulatory Visit: Payer: BC Managed Care – PPO | Attending: Cardiology | Admitting: Cardiology

## 2022-05-11 VITALS — BP 140/82 | HR 66 | Ht 76.0 in | Wt 284.0 lb

## 2022-05-11 DIAGNOSIS — Z79899 Other long term (current) drug therapy: Secondary | ICD-10-CM | POA: Diagnosis not present

## 2022-05-11 DIAGNOSIS — I447 Left bundle-branch block, unspecified: Secondary | ICD-10-CM

## 2022-05-11 DIAGNOSIS — I4819 Other persistent atrial fibrillation: Secondary | ICD-10-CM | POA: Diagnosis not present

## 2022-05-11 NOTE — Patient Instructions (Signed)
Medication Instructions:  Your physician recommends that you continue on your current medications as directed. Please refer to the Current Medication list given to you today.  *If you need a refill on your cardiac medications before your next appointment, please call your pharmacy*  Lab Work: TODAY: BMET and Mg If you have labs (blood work) drawn today and your tests are completely normal, you will receive your results only by: MyChart Message (if you have MyChart) OR A paper copy in the mail If you have any lab test that is abnormal or we need to change your treatment, we will call you to review the results.  Testing/Procedures: Your physician has requested that you have an echocardiogram. Echocardiography is a painless test that uses sound waves to create images of your heart. It provides your doctor with information about the size and shape of your heart and how well your heart's chambers and valves are working. This procedure takes approximately one hour. There are no restrictions for this procedure. Please do NOT wear cologne, perfume, aftershave, or lotions (deodorant is allowed). Please arrive 15 minutes prior to your appointment time.  Follow-Up: At [facility], you and your health needs are our priority.  As part of our continuing mission to provide you with exceptional heart care, we have created designated Provider Care Teams.  These Care Teams include your primary Cardiologist (physician) and Advanced Practice Providers (APPs -  Physician Assistants and Nurse Practitioners) who all work together to provide you with the care you need, when you need it.  Your next appointment:   3 month(s)  The format for your next appointment:   In Person  Provider:   You will see one of the following Advanced Practice Providers on your designated Care Team:   Francis Dowse, [place] Casimiro Needle "Mardelle Matte" Lanna Poche, [place]  Important Information About Sugar

## 2022-05-11 NOTE — Progress Notes (Signed)
Electrophysiology Office Follow up Visit Note:    Date:  05/11/2022   ID:  Jesse Rice, DOB 06/02/1955, MRN [id number]  PCP:  Jesse Pilon, FNP  [facility] Cardiologist:  Jesse Sprague, MD  [facility] Electrophysiologist:  Jesse Prude, MD    Interval History:    Jesse Rice is a 66 y.o. male who presents for a follow up visit. He had previously been treated with Tikosyn due to recurrent, symptomatic atrial fibrillation. 05/06/2021 EKG while in Afib shows intermittent left bundle branch block.   He underwent atrial fibrillation ablation on 05/28/2021. He followed up in cardiology with Jesse Rice, 06/29/2021.  Also, he had completed CPAP titration on 06/10/2021.   He were last seen on 03/28/2021 for an electrophysiology procedure.  Today the patient states that he has been feeling well.   He states that he does feel tired sometimes but most of the time he is energized.   He denies any palpitations, chest pain, shortness of breath, or peripheral edema. No lightheadedness, headaches, syncope, orthopnea, or PND.   Past Medical History:  Diagnosis Date   Atrial fibrillation (HCC)    s/p DCCV in 11/2020   Dilated aortic root (HCC)    Echo 7/22: 41 mm   Hypertension    Hypertensive pulmonary venous disease (HCC) 05/20/2014   IBS (irritable bowel syndrome)    LBBB (left bundle branch block)    rate related    Past Surgical History:  Procedure Laterality Date   ATRIAL FIBRILLATION ABLATION N/A 05/28/2021   Procedure: ATRIAL FIBRILLATION ABLATION;  Surgeon: Jesse Prude, MD;  [facility] INV[facility]IVE CV LAB;  Service: Cardiovascular;  Laterality: N/A;   CARDIOVERSION N/A 12/23/2020   Procedure: CARDIOVERSION;  Surgeon: Jesse Sprague, MD;  Location: [facility] ENDOSCOPY;  Service: Cardiovascular;  Laterality: N/A;   CARDIOVERSION N/A 03/04/2021   Procedure: CARDIOVERSION;  Surgeon: Jesse Ripple, DO;  Location: [facility] ENDOSCOPY;  Service: Cardiovascular;   Laterality: N/A;   CARDIOVERSION N/A 04/28/2021   Procedure: CARDIOVERSION;  Surgeon: Jesse Sprague, MD;  Location: [facility] ENDOSCOPY;  Service: Cardiovascular;  Laterality: N/A;   CHOLECYSTECTOMY     TEE WITHOUT CARDIOVERSION N/A 12/23/2020   Procedure: TRANSESOPHAGEAL ECHOCARDIOGRAM (TEE);  Surgeon: Jesse Sprague, MD;  Location: [facility] ENDOSCOPY;  Service: Cardiovascular;  Laterality: N/A;    Current Medications: Current Meds  Medication Sig   diphenoxylate-atropine (LOMOTIL) 2.5-0.025 MG tablet Take 1 tablet by mouth 4 (four) times daily as needed for diarrhea or loose stools.   dofetilide (TIKOSYN) 500 MCG capsule Take 1 capsule (500 mcg total) by mouth 2 (two) times daily.   ELIQUIS 5 MG TABS tablet TAKE 1 TABLET TWICE A DAY   ferrous sulfate 325 (65 FE) MG tablet Take 1 tablet by mouth daily.   metoprolol succinate (TOPROL-XL) 50 MG 24 hr tablet Take 1 tablet (50 mg total) by mouth in the morning and at bedtime. Take with or immediately following a meal.     Allergies:   Patient has no allergy information on record.   Social History   Socioeconomic History   Marital status: Single    Spouse name: Not on file   Number of children: Not on file   Years of education: Not on file   Highest education level: Not on file  Occupational History   Not on file  Tobacco Use   Smoking status: Former    Packs/day: 0.50    Types: Cigarettes   Smokeless tobacco: Never  Tobacco comments:    Former smoker 05/06/2021  Substance and Sexual Activity   Alcohol use: No   Drug use: No   Sexual activity: Not on file  Other Topics Concern   Not on file  Social History Narrative   Not on file   Social Determinants of Health   Financial Resource Strain: Not on file  Food Insecurity: Not on file  Transportation Needs: Not on file  Physical Activity: Not on file  Stress: Not on file  Social Connections: Not on file     Family History: The patient's family history includes  Arrhythmia in his mother; COPD in his father; Diabetes in his father; Heart murmur in his father; Hypertension in his father and mother; Rheumatic fever in his father.  ROS:   Please see the history of present illness.     All other systems reviewed and are negative.  EKGs/Labs/Other Studies Reviewed:    The following studies were reviewed today:    EKG:  EKG is personally reviewed.  05/11/2022: 66bpm with LBBB. QRS duration 188ms.   Recent Labs: 01/04/2022: BUN 11; Creatinine, Ser 1.34; Hemoglobin 13.7; Magnesium 1.9; Platelets 257; Potassium 4.6; Sodium 141   Recent Lipid Panel No results found for: "CHOL", "TRIG", "HDL", "CHOLHDL", "VLDL", "LDLCALC", "LDLDIRECT"  Physical Exam:    VS:  BP (!) 140/82   Pulse 66   Ht 6\' 4"  (1.93 m)   Wt 284 lb (128.8 kg)   SpO2 96%   BMI 34.57 kg/m     Wt Readings from Last 3 Encounters:  05/11/22 284 lb (128.8 kg)  01/04/22 287 lb 3.2 oz (130.3 kg)  09/03/21 287 lb 3.2 oz (130.3 kg)     GEN: Well nourished, well developed in no acute distress. obese HEENT: Normal NECK: No JVD; No carotid bruits LYMPHATICS: No lymphadenopathy CARDIAC: RRR, no murmurs, rubs, gallops RESPIRATORY:  Clear to auscultation without rales, wheezing or rhonchi  ABDOMEN: Soft, non-tender, non-distended MUSCULOSKELETAL:  No edema; No deformity  SKIN: Warm and dry NEUROLOGIC:  Alert and oriented x 3 PSYCHIATRIC:  Normal affect        ASSESSMENT:    1. Persistent atrial fibrillation ([place])   2. Encounter for long-term (current) use of high-risk medication   3. LBBB (left bundle branch block)    PLAN:    In order of problems listed above:  #Persistent AF Doing well on dofetilide and after ablation. QTc acceptable for continued dofetilide use. Check BMP and Mag today.  #LBBB Persistnet on today's ECG. Prior ECG have shown intermittent. ? Rate related. Will get an echo to confirm no changes in LV function.    Follow-up in 3  months   Medication Adjustments/Labs and Tests Ordered: Current medicines are reviewed at length with the patient today.  Concerns regarding medicines are outlined above.   Orders Placed This Encounter  Procedures   Basic metabolic panel   Magnesium   EKG 12-Lead   ECHOCARDIOGRAM COMPLETE   No orders of the defined types were placed in this encounter.   I,Fatima Elhorry,acting as a Education administrator for Vickie Epley, MD.,have documented all relevant documentation on the behalf of Vickie Epley, MD,as directed by  Vickie Epley, MD while in the presence of Vickie Epley, MD.  I, Vickie Epley, MD, have reviewed all documentation for this visit. The documentation on 05/11/22 for the exam, diagnosis, procedures, and orders are all accurate and complete.   Signed, Lars Mage, MD, [facility], [facility] 05/11/2022 5:12 PM  Electrophysiology [facility]

## 2022-05-12 LAB — BASIC METABOLIC PANEL
BUN/Creatinine Ratio: 10 (ref 10–24)
BUN: 14 mg/dL (ref 8–27)
CO2: 23 mmol/L (ref 20–29)
Calcium: 9.7 mg/dL (ref 8.6–10.2)
Chloride: 100 mmol/L (ref 96–106)
Creatinine, Ser: 1.37 mg/dL — ABNORMAL HIGH (ref 0.76–1.27)
Glucose: 90 mg/dL (ref 70–99)
Potassium: 4.8 mmol/L (ref 3.5–5.2)
Sodium: 139 mmol/L (ref 134–144)
eGFR: 58 mL/min/{1.73_m2} — ABNORMAL LOW (ref >59)

## 2022-05-12 LAB — MAGNESIUM: Magnesium: 2 mg/dL (ref 1.6–2.3)

## 2022-05-27 ENCOUNTER — Encounter: Payer: Self-pay | Admitting: Cardiology

## 2022-05-27 ENCOUNTER — Telehealth: Payer: Self-pay | Admitting: Cardiology

## 2022-05-27 ENCOUNTER — Other Ambulatory Visit (HOSPITAL_COMMUNITY): Payer: Self-pay

## 2022-05-27 MED ORDER — DOFETILIDE 500 MCG PO CAPS
500.0000 ug | ORAL_CAPSULE | Freq: Two times a day (BID) | ORAL | 2 refills | Status: DC
  Filled 2022-05-27: qty 60, 30d supply, fill #0
  Filled 2022-06-27: qty 60, 30d supply, fill #1
  Filled 2022-07-22: qty 60, 30d supply, fill #2
  Filled 2022-08-22: qty 60, 30d supply, fill #3
  Filled 2022-09-20: qty 60, 30d supply, fill #4
  Filled 2022-10-25: qty 60, 30d supply, fill #5
  Filled 2022-11-21: qty 60, 30d supply, fill #6
  Filled 2022-12-27: qty 60, 30d supply, fill #7

## 2022-05-27 NOTE — Telephone Encounter (Signed)
Pt calling back about prior authorization request for this medication. He only has two days left. Please advise

## 2022-05-27 NOTE — Telephone Encounter (Signed)
Pt c/o medication issue:  1. Name of Medication: dofetilide (TIKOSYN) 500 MCG capsule  2. How are you currently taking this medication (dosage and times per day)? As prescribed  3. Are you having a reaction (difficulty breathing--STAT)? No  4. What is your medication issue? Pt needs prior auth and another refill sent for medication sent to: [facility]

## 2022-05-27 NOTE — Telephone Encounter (Signed)
Medication reordered and sent to preferred pharmacy. Patient had visit and EKG with Dr. Lalla Brothers on 05/11/22.

## 2022-05-27 NOTE — Telephone Encounter (Signed)
**Note De-Identified Gustabo Gordillo Obfuscation** Urgent Dofetilide PA started through covermymeds. Key: [id number]

## 2022-05-27 NOTE — Telephone Encounter (Signed)
PA was sent in today for the patients Tikosyn.  When I spoke to the patient he stated he only has 2 pills left.  I asked the pharmacy for a cash price for a 14 day supply while we work through the [place] and it is $17.85.  Patient is aware and will go to the pharmacy today as they are closed Sat-Mon and will not open until Tues.    Called Lynn Via, LPN who handels PA's and she was able to see the PA in cover my meds and will start the process as urgent.  Patient was very appreciative of our efforts in getting this done as he understands missing doses requires hospitalization.

## 2022-05-27 NOTE — Telephone Encounter (Signed)
Error

## 2022-05-31 ENCOUNTER — Other Ambulatory Visit (HOSPITAL_COMMUNITY): Payer: Self-pay

## 2022-05-31 NOTE — Telephone Encounter (Signed)
**Note De-Identified Leauna Sharber Obfuscation** CURLY MACKOWSKI (KeyOvidio Kin) PA Case ID #: [id number] Rx #: [id number] Outcome: Approved on May 27, 2022 Authorization Expiration Date: 05/27/2023 Drug Dofetilide 500MCG capsules Form [employer] Electronic PA Form (2017 NCPDP) Original Claim Info 22 PA REQUIRED, PLEASE CALL [phone] REQUIRES PRIOR AUTHORIZATION  I have notified [place] (Ph: [phone] of this approval.

## 2022-06-06 DIAGNOSIS — K580 Irritable bowel syndrome with diarrhea: Secondary | ICD-10-CM | POA: Diagnosis not present

## 2022-06-07 ENCOUNTER — Telehealth: Payer: Self-pay | Admitting: *Deleted

## 2022-06-07 DIAGNOSIS — R931 Abnormal findings on diagnostic imaging of heart and coronary circulation: Secondary | ICD-10-CM | POA: Insufficient documentation

## 2022-06-07 NOTE — Telephone Encounter (Signed)
   Pre-operative Risk Assessment    Patient Name: Jesse Rice  DOB: May 13, 1956 MRN: [id number]  Request for Surgical Clearance    Procedure:   COLONOSCOPY   Date of Surgery:  Clearance 06/30/22                                 Surgeon:  DR Alessandra Bevels Surgeon's Group or Practice Name:  [facility] Phone number:  [phone] Fax number:  [phone]   Type of Clearance Requested:   - Pharmacy:  Hold Apixaban (Eliquis) OKAY 1 TO 2 DAYS PRIOR   AND MEDICAL    Type of Anesthesia:  General  {  Signed, Claude Manges   06/07/2022, 2:52 PM

## 2022-06-07 NOTE — Telephone Encounter (Signed)
Patient with diagnosis of afib on Eliquis for anticoagulation.    Procedure: colonoscopy Date of procedure: 06/30/22  CHA2DS2-VASc Score = 2  This indicates a 2.2% annual risk of stroke. The patient's score is based upon: CHF History: 0 HTN History: 1 Diabetes History: 0 Stroke History: 0 Vascular Disease History: 1 Age Score: 0 Gender Score: 0  CrCl 89mL/min using adjusted body weight Platelet count 257K  Per office protocol, patient can hold Eliquis for 1-2 days prior to procedure.    **This guidance is not considered finalized until pre-operative APP has relayed final recommendations.**

## 2022-06-07 NOTE — Telephone Encounter (Signed)
   Patient Name: Jesse Rice  DOB: Sep 24, 1955 MRN: [id number]  Primary Cardiologist: Freada Bergeron, MD  Chart reviewed as part of pre-operative protocol coverage. Pre-op clearance already addressed by colleagues in earlier phone notes. To summarize recommendations:  -Per office protocol, patient can hold Eliquis for 1-2 days prior to procedure.     Medical clearance was not requested.  Will route this bundled recommendation to requesting provider via Epic fax function and remove from pre-op pool. Please call with questions.  Elgie Collard, PA-C 06/07/2022, 3:40 PM

## 2022-06-16 ENCOUNTER — Ambulatory Visit (HOSPITAL_COMMUNITY): Payer: BC Managed Care – PPO | Attending: Internal Medicine

## 2022-06-16 DIAGNOSIS — I4819 Other persistent atrial fibrillation: Secondary | ICD-10-CM | POA: Diagnosis not present

## 2022-06-16 LAB — ECHOCARDIOGRAM COMPLETE
Area-P 1/2: 3.39 cm2
MV M vel: 5.88 m/s
MV Peak grad: 138.3 mmHg
S' Lateral: 2.7 cm

## 2022-06-27 ENCOUNTER — Other Ambulatory Visit (HOSPITAL_COMMUNITY): Payer: Self-pay

## 2022-06-30 DIAGNOSIS — K648 Other hemorrhoids: Secondary | ICD-10-CM | POA: Diagnosis not present

## 2022-06-30 DIAGNOSIS — D124 Benign neoplasm of descending colon: Secondary | ICD-10-CM | POA: Diagnosis not present

## 2022-06-30 DIAGNOSIS — Z09 Encounter for follow-up examination after completed treatment for conditions other than malignant neoplasm: Secondary | ICD-10-CM | POA: Diagnosis not present

## 2022-06-30 DIAGNOSIS — Z86010 Personal history of colonic polyps: Secondary | ICD-10-CM | POA: Diagnosis not present

## 2022-07-11 DIAGNOSIS — G4733 Obstructive sleep apnea (adult) (pediatric): Secondary | ICD-10-CM | POA: Diagnosis not present

## 2022-07-15 DIAGNOSIS — G4733 Obstructive sleep apnea (adult) (pediatric): Secondary | ICD-10-CM | POA: Diagnosis not present

## 2022-08-09 DIAGNOSIS — G4733 Obstructive sleep apnea (adult) (pediatric): Secondary | ICD-10-CM | POA: Diagnosis not present

## 2022-08-10 ENCOUNTER — Ambulatory Visit: Payer: BC Managed Care – PPO | Attending: Physician Assistant | Admitting: Physician Assistant

## 2022-08-10 ENCOUNTER — Encounter: Payer: Self-pay | Admitting: Physician Assistant

## 2022-08-10 VITALS — BP 112/76 | HR 68 | Ht 76.0 in | Wt 284.6 lb

## 2022-08-10 DIAGNOSIS — D6869 Other thrombophilia: Secondary | ICD-10-CM

## 2022-08-10 DIAGNOSIS — Z5181 Encounter for therapeutic drug level monitoring: Secondary | ICD-10-CM | POA: Diagnosis not present

## 2022-08-10 DIAGNOSIS — I10 Essential (primary) hypertension: Secondary | ICD-10-CM | POA: Diagnosis not present

## 2022-08-10 DIAGNOSIS — I4819 Other persistent atrial fibrillation: Secondary | ICD-10-CM | POA: Diagnosis not present

## 2022-08-10 DIAGNOSIS — Z79899 Other long term (current) drug therapy: Secondary | ICD-10-CM | POA: Diagnosis not present

## 2022-08-10 DIAGNOSIS — I447 Left bundle-branch block, unspecified: Secondary | ICD-10-CM | POA: Diagnosis not present

## 2022-08-10 NOTE — Progress Notes (Signed)
Cardiology Office Note Date:  08/10/2022  Patient ID:  Jesse Rice, Jesse Rice 10/20/1955, MRN [id number] PCP:  Kristen Loader, FNP  Cardiologist:  Dr. Johney Frame Electrophysiologist: Dr. Quentin Ore     Chief Complaint:   3 mo  History of Present Illness: Jesse Rice is a 67 y.o. male with history of HTN, rate related LBBB, Afib, dilated Ao root.  He las saw cardiology team by Kathleen Argue, PA, 02/26/21, was feeling well post DCCV.  He was back in AFib and unaware, referred to EP for rhythm management options. Planned for CT in 2023 to monitor his Aorta and an echo in a couple years to follow his MR. Pending sleep study  I saw him [id number] He comes today to be seen for Dr. Quentin Ore, last seen by him 09/03/21, he would check his rhythm occasionally noting no AFib.  Did have some episode of fatigue/reduced exertional capacity with plans to resume his iron supplements Was maintaining SR, planned to continue dofetilide at least a year post ablation  Qtc was stable No changes made  He saw Dr. Quentin Ore 05/11/22, doing well, stable QTc, noting LBBB, suspect rate related, planned for echo  Echo with LVEF 65-70%, mild-mod MR  TODAY He comes with his wife today He feels well Denies any CP, palpitations or cardiac awareness. No SOB No near syncope or syncope. No bleeding or signs of bleeding  He is not using his CPAP regularly, uncomfortable They ask if his frequency of visits/EKGs can be reduced for cost savings.   Afib/AAD hx Diagnosed June 2022 TEE/DCCV 12/23/20 Tikosyn started Nov 2022 PVI ablation 05/28/21   Past Medical History:  Diagnosis Date   Atrial fibrillation ([place])    s/p DCCV in 11/2020   Dilated aortic root ([place])    Echo 7/22: 41 mm   Hypertension    Hypertensive pulmonary venous disease ([place]) 05/20/2014   IBS (irritable bowel syndrome)    LBBB (left bundle branch block)    rate related    Past Surgical History:  Procedure Laterality Date   ATRIAL FIBRILLATION  ABLATION N/A 05/28/2021   Procedure: ATRIAL FIBRILLATION ABLATION;  Surgeon: Vickie Epley, MD;  Location: [place] CV LAB;  Service: Cardiovascular;  Laterality: N/A;   CARDIOVERSION N/A 12/23/2020   Procedure: CARDIOVERSION;  Surgeon: Freada Bergeron, MD;  Location: [facility] ENDOSCOPY;  Service: Cardiovascular;  Laterality: N/A;   CARDIOVERSION N/A 03/04/2021   Procedure: CARDIOVERSION;  Surgeon: Berniece Salines, DO;  Location: [place];  Service: Cardiovascular;  Laterality: N/A;   CARDIOVERSION N/A 04/28/2021   Procedure: CARDIOVERSION;  Surgeon: Freada Bergeron, MD;  Location: [facility] ENDOSCOPY;  Service: Cardiovascular;  Laterality: N/A;   CHOLECYSTECTOMY     TEE WITHOUT CARDIOVERSION N/A 12/23/2020   Procedure: TRANSESOPHAGEAL ECHOCARDIOGRAM (TEE);  Surgeon: Freada Bergeron, MD;  Location: [facility] ENDOSCOPY;  Service: Cardiovascular;  Laterality: N/A;    Current Outpatient Medications  Medication Sig Dispense Refill   atorvastatin (LIPITOR) 10 MG tablet Take 1 tablet (10 mg total) by mouth daily. 90 tablet 3   diphenoxylate-atropine (LOMOTIL) 2.5-0.025 MG tablet Take 1 tablet by mouth 4 (four) times daily as needed for diarrhea or loose stools.     dofetilide (TIKOSYN) 500 MCG capsule Take 1 capsule (500 mcg total) by mouth 2 (two) times daily. 180 capsule 2   ELIQUIS 5 MG TABS tablet TAKE 1 TABLET TWICE A DAY 180 tablet 1   ferrous sulfate 325 (65 FE) MG tablet Take 1 tablet by mouth daily.  metoprolol succinate (TOPROL-XL) 50 MG 24 hr tablet Take 1 tablet (50 mg total) by mouth in the morning and at bedtime. Take with or immediately following a meal. 180 tablet 3   No current facility-administered medications for this visit.    Allergies:   Patient has no allergy information on record.   Social History:  The patient  reports that he has quit smoking. His smoking use included cigarettes. He smoked an average of .5 packs per day. He has never used smokeless tobacco. He  reports that he does not drink alcohol and does not use drugs.   Family History:  The patient's family history includes Arrhythmia in his mother; COPD in his father; Diabetes in his father; Heart murmur in his father; Hypertension in his father and mother; Rheumatic fever in his father.  ROS:  Please see the history of present illness.    All other systems are reviewed and otherwise negative.   PHYSICAL EXAM:  VS:  There were no vitals taken for this visit. BMI: There is no height or weight on file to calculate BMI. Well nourished, well developed, in no acute distress HEENT: normocephalic, atraumatic Neck: no JVD, carotid bruits or masses Cardiac: RRR; no significant murmurs, no rubs, or gallops Lungs: CTA b/l, no wheezing, rhonchi or rales Abd: soft, nontender MS: no deformity or atrophy Ext: no edema Skin: warm and dry, no rash Neuro:  No gross deficits appreciated Psych: euthymic mood, full affect   EKG:  Done today and reviewed by myself shows  SR 68bpm, LBBB, LAD, QRS 178m, QTc 484 (better given QRS duration)  06/16/22: TTE 1. Left ventricular ejection fraction, by estimation, is 65 to 70%. Left  ventricular ejection fraction by PLAX is 66 %. The left ventricle has  normal function. The left ventricle has no regional wall motion  abnormalities. There is mild left ventricular  hypertrophy. Left ventricular diastolic parameters were normal.   2. Right ventricular systolic function is normal. The right ventricular  size is normal. Tricuspid regurgitation signal is inadequate for assessing  PA pressure.   3. Left atrial size was moderately dilated.   4. Right atrial size was moderately dilated.   5. The mitral valve is abnormal. Mild mitral valve regurgitation.   6. The aortic valve is tricuspid. Aortic valve regurgitation is not  visualized.   7. Aortic dilatation noted. There is mild dilatation of the ascending  aorta, measuring 41 mm.   8. The inferior vena cava is normal  in size with greater than 50%  respiratory variability, suggesting right atrial pressure of 3 mmHg.   Comparison(s): Changes from prior study are noted. 12/17/2020: LVEF 60-65%,  mild to moderate MR.     12/21/21: CT angio chest IMPRESSION: 1. No thoracic aorta abnormality. 2. No acute intrathoracic abnormality.  05/18/2021: EPS/ablation CONCLUSIONS: 1. Successful PVI 2. Successful ablation/isolation of the posterior wall 3. Intracardiac echo reveals trivial to small pericardial effusion, left-sided common ostium, normal left ventricular function 4. No early apparent complications. 5.  Colchicine 0.6 mg by mouth twice daily for 5 days 6.  Protonix 40 mg by mouth once daily for 45 days  05/20/21: Cardiac CT IMPRESSION: 1. There is normal pulmonary vein drainage into the left atrium. Common ostium left sided pulmonary veins. Measurements as reported 2.  There is no thrombus in the left atrial appendage. 3. Esophagus courses posterior to ostium of left sided pulmonary veins 4.  Coronary calcium score 74 (55th percentile)    12/23/2020: TEE  1. Left ventricular ejection fraction, by estimation, is 60 to 65%. The  left ventricle has normal function.   2. Right ventricular systolic function is normal. The right ventricular  size is normal.   3. No left atrial/left atrial appendage thrombus was detected.   4. The mitral valve is normal in structure. There is mild-to-moderate vs  moderate mitral regurgitation with multiple jets seen along the coaptation  line. Degree of MR varied with blood pressure during the study. No  evidence of mitral stenosis.   5. The aortic valve is tricuspid. There is mild thickening of the aortic  valve. Aortic valve regurgitation is not visualized. Mild aortic valve  sclerosis is present, with no evidence of aortic valve stenosis.   6. There is mild (Grade II) plaque.    TEE 12/23/20 EF 60-65, normal RVSF, no LAA clot, mild to mod MR, AV sclerosis    Echocardiogram 12/17/20 EF 60-65, no RWMA, normal RVSF, mild to mod MR, Ao root 41 mm  Recent Labs: 01/04/2022: Hemoglobin 13.7; Platelets 257 05/11/2022: BUN 14; Creatinine, Ser 1.37; Magnesium 2.0; Potassium 4.8; Sodium 139  No results found for requested labs within last 365 days.   CrCl cannot be calculated (Patient's most recent lab result is older than the maximum 21 days allowed.).   Wt Readings from Last 3 Encounters:  05/11/22 284 lb (128.8 kg)  01/04/22 287 lb 3.2 oz (130.3 kg)  09/03/21 287 lb 3.2 oz (130.3 kg)     Other studies reviewed: Additional studies/records reviewed today include: summarized above  ASSESSMENT AND PLAN:  Persistent Afib CHA2DS2Vasc is one, on Eliquis, appropriately dosed Post ablation Tikosyn with stable QTc Low/no burden by symptoms/kardia monitoring Labs today Med list looks ok  Will push his visits to 30mohopefully that helps, will reach out to Dr. LQuentin Ore Urged CPAP compliance   HTN Looks great   3. LBBB Historically rate related by chart notes Continues Preserved LVEF No symptoms of HF or CAD Minimal Ca on his CT scan last year   4. Secondary hypercoagulable state    Disposition: 4 mo sooner if needed   Current medicines are reviewed at length with the patient today.  The patient did not have any concerns regarding medicines.  SVenetia Night PA-C 08/10/2022 7:20 AM     [place] [phone])  [phone](fax)

## 2022-08-10 NOTE — Patient Instructions (Signed)
Medication Instructions:   Your physician recommends that you continue on your current medications as directed. Please refer to the Current Medication list given to you today.   *If you need a refill on your cardiac medications before your next appointment, please call your pharmacy*   Lab Work: BMET AND MAG TODAY    If you have labs (blood work) drawn today and your tests are completely normal, you will receive your results only by: MyChart Message (if you have MyChart) OR A paper copy in the mail If you have any lab test that is abnormal or we need to change your treatment, we will call you to review the results.   Testing/Procedures: NONE ORDERED  TODAY    Follow-Up: At [facility], you and your health needs are our priority.  As part of our continuing mission to provide you with exceptional heart care, we have created designated Provider Care Teams.  These Care Teams include your primary Cardiologist (physician) and Advanced Practice Providers (APPs -  Physician Assistants and Nurse Practitioners) who all work together to provide you with the care you need, when you need it.  We recommend signing up for the patient portal called "MyChart".  Sign up information is provided on this After Visit Summary.  MyChart is used to connect with patients for Virtual Visits (Telemedicine).  Patients are able to view lab/test results, encounter notes, upcoming appointments, etc.  Non-urgent messages can be sent to your provider as well.   To learn more about what you can do with MyChart, go to [email or web address].    Your next appointment:   4 month(s)  Provider:   You may see CAMERON T LAMBERT, MD or one of the following Advanced Practice Providers on your designated Care Team:   Renee Ursuy, PA-C   Other Instructions  

## 2022-08-11 LAB — BASIC METABOLIC PANEL
BUN/Creatinine Ratio: 14 (ref 10–24)
BUN: 18 mg/dL (ref 8–27)
CO2: 25 mmol/L (ref 20–29)
Calcium: 9.5 mg/dL (ref 8.6–10.2)
Chloride: 104 mmol/L (ref 96–106)
Creatinine, Ser: 1.31 mg/dL — ABNORMAL HIGH (ref 0.76–1.27)
Glucose: 89 mg/dL (ref 70–99)
Potassium: 4.8 mmol/L (ref 3.5–5.2)
Sodium: 143 mmol/L (ref 134–144)
eGFR: 61 mL/min/{1.73_m2} (ref >59)

## 2022-08-11 LAB — MAGNESIUM: Magnesium: 2.1 mg/dL (ref 1.6–2.3)

## 2022-08-17 DIAGNOSIS — K580 Irritable bowel syndrome with diarrhea: Secondary | ICD-10-CM | POA: Diagnosis not present

## 2022-08-17 DIAGNOSIS — E782 Mixed hyperlipidemia: Secondary | ICD-10-CM | POA: Diagnosis not present

## 2022-08-17 DIAGNOSIS — N183 Chronic kidney disease, stage 3 unspecified: Secondary | ICD-10-CM | POA: Diagnosis not present

## 2022-08-17 DIAGNOSIS — I10 Essential (primary) hypertension: Secondary | ICD-10-CM | POA: Diagnosis not present

## 2022-08-18 ENCOUNTER — Telehealth: Payer: Self-pay | Admitting: Cardiology

## 2022-08-18 NOTE — Telephone Encounter (Signed)
Pt called to report that his PCP is wanting to put him back on the Lisinopril for his elevated BP.. the pt is asking why we took him off of it in the first place;   Liliane Shi, [place] 03/01/2021  7:38 AM EDT     Creatinine increased.  K+ normal.  Glucose low.  Hgb normal.   PLAN: -DC Lisinopril/HCTZ -Monitor BP once daily - call if BP >/= 140/90 -Repeat BMET Thursday AM Richardson Dopp, PA-C   03/01/2021 7:28 AM      I advised the pt and I will forward to his PCP.  I advised the pt that his PCP can treat his BP and he should go with his recommendations.

## 2022-08-18 NOTE — Telephone Encounter (Signed)
Pt c/o medication issue:  1. Name of Medication: Lisinopril   2. How are you currently taking this medication (dosage and times per day)? Stopped taking this over a year ago   3. Are you having a reaction (difficulty breathing--STAT)?   4. What is your medication issue? Pt is requesting call back to discuss why he was taken off of this medication. States that his PCP is inquiring.

## 2022-09-08 ENCOUNTER — Other Ambulatory Visit: Payer: Self-pay | Admitting: Cardiology

## 2022-09-08 DIAGNOSIS — I10 Essential (primary) hypertension: Secondary | ICD-10-CM

## 2022-09-08 DIAGNOSIS — I4891 Unspecified atrial fibrillation: Secondary | ICD-10-CM

## 2022-09-08 DIAGNOSIS — G4733 Obstructive sleep apnea (adult) (pediatric): Secondary | ICD-10-CM

## 2022-09-08 NOTE — Telephone Encounter (Signed)
Eliquis 5mg  refill request received. Patient is 67 years old, weight-129.1kg, Crea-1.31 on 08/10/22, Diagnosis-Afib, and last seen by Yevette Edwards on 08/10/22. Dose is appropriate based on dosing criteria. Will send in refill to requested pharmacy.

## 2022-09-09 DIAGNOSIS — G4733 Obstructive sleep apnea (adult) (pediatric): Secondary | ICD-10-CM | POA: Diagnosis not present

## 2022-11-23 DIAGNOSIS — G4733 Obstructive sleep apnea (adult) (pediatric): Secondary | ICD-10-CM | POA: Diagnosis not present

## 2022-12-23 DIAGNOSIS — G4733 Obstructive sleep apnea (adult) (pediatric): Secondary | ICD-10-CM | POA: Diagnosis not present

## 2022-12-28 ENCOUNTER — Ambulatory Visit: Payer: BC Managed Care – PPO | Attending: Cardiology | Admitting: Cardiology

## 2022-12-28 ENCOUNTER — Encounter: Payer: Self-pay | Admitting: Cardiology

## 2022-12-28 ENCOUNTER — Telehealth: Payer: Self-pay | Admitting: Cardiology

## 2022-12-28 ENCOUNTER — Other Ambulatory Visit (HOSPITAL_COMMUNITY): Payer: Self-pay

## 2022-12-28 VITALS — BP 130/72 | HR 72 | Ht 76.0 in | Wt 289.8 lb

## 2022-12-28 DIAGNOSIS — I4819 Other persistent atrial fibrillation: Secondary | ICD-10-CM

## 2022-12-28 DIAGNOSIS — I447 Left bundle-branch block, unspecified: Secondary | ICD-10-CM

## 2022-12-28 MED ORDER — DOFETILIDE 500 MCG PO CAPS
500.0000 ug | ORAL_CAPSULE | Freq: Two times a day (BID) | ORAL | 2 refills | Status: DC
  Filled 2022-12-28: qty 60, 30d supply, fill #0
  Filled 2023-01-23 – 2023-01-25 (×3): qty 60, 30d supply, fill #1

## 2022-12-28 MED ORDER — DOFETILIDE 500 MCG PO CAPS: 500.0000 ug | ORAL_CAPSULE | Freq: Two times a day (BID) | ORAL | 2 refills | Status: DC

## 2022-12-28 NOTE — Telephone Encounter (Signed)
Pt's medication was sent to pt's pharmacy as requested. Confirmation received.  °

## 2022-12-28 NOTE — Progress Notes (Signed)
  Electrophysiology Office Follow up Visit Note:    Date:  12/28/2022   ID:  Jesse Rice, DOB 1955/10/17, MRN [id number]  PCP:  Soundra Pilon, FNP  [facility] Cardiologist:  Meriam Sprague, MD  [facility] Electrophysiologist:  Lanier Prude, MD    Interval History:    Jesse Rice is a 67 y.o. male who presents for a follow up visit.   Last seen by [facility] August 10, 2022 for persistent atrial fibrillation on dofetilide.  He takes Eliquis for stroke prophylaxis.  He has been doing well since I last saw him.  No recurrent episodes of arrhythmia.  Continues to take Eliquis and Tikosyn.     Past medical, surgical, social and family history were reviewed.  ROS:   Please see the history of present illness.    All other systems reviewed and are negative.  EKGs/Labs/Other Studies Reviewed:    The following studies were reviewed today:    EKG Interpretation Date/Time:  Wednesday December 28 2022 16:00:33 EDT Ventricular Rate:  72 PR Interval:  156 QRS Duration:  146 QT Interval:  452 QTC Calculation: 494 R Axis:   49  Text Interpretation: Normal sinus rhythm Left bundle branch block Confirmed by Steffanie Dunn [phone]) on 12/28/2022 4:05:49 PM    Physical Exam:    VS:  BP 130/72 (BP Location: Left Arm, Patient Position: Sitting, Cuff Size: Large)   Pulse 72   Ht 6\' 4"  (1.93 m)   Wt 289 lb 12.8 oz (131.5 kg)   SpO2 96%   BMI 35.28 kg/m     Wt Readings from Last 3 Encounters:  12/28/22 289 lb 12.8 oz (131.5 kg)  08/10/22 284 lb 9.6 oz (129.1 kg)  05/11/22 284 lb (128.8 kg)     GEN:  Well nourished, well developed in no acute distress CARDIAC: RRR, no murmurs, rubs, gallops RESPIRATORY:  Clear to auscultation without rales, wheezing or rhonchi       ASSESSMENT:    1. Persistent atrial fibrillation (HCC)   2. LBBB (left bundle branch block)    PLAN:    In order of problems listed above:   #Persistent atrial fibrillation #High risk  drug monitoring-dofetilide Doing well on Tikosyn.  QTc stable for ongoing use. Update BMP and magnesium today.  #Obstructive sleep apnea CPAP use encouraged  #Hypertension At goal today.  Recommend checking blood pressures 1-2 times per week at home and recording the values.  Recommend bringing these recordings to the primary care physician.   Follow-up 4 months with APP.    Signed, Steffanie Dunn, MD, [facility], [facility] 12/28/2022 4:16 PM    Electrophysiology [facility]

## 2022-12-28 NOTE — Patient Instructions (Signed)
Medication Instructions:  Your physician recommends that you continue on your current medications as directed. Please refer to the Current Medication list given to you today.  *If you need a refill on your cardiac medications before your next appointment, please call your pharmacy*   Lab Work: TODAY: BMET and Mag If you have labs (blood work) drawn today and your tests are completely normal, you will receive your results only by: MyChart Message (if you have MyChart) OR A paper copy in the mail If you have any lab test that is abnormal or we need to change your treatment, we will call you to review the results.  Follow-Up: At [facility], you and your health needs are our priority.  As part of our continuing mission to provide you with exceptional heart care, we have created designated Provider Care Teams.  These Care Teams include your primary Cardiologist (physician) and Advanced Practice Providers (APPs -  Physician Assistants and Nurse Practitioners) who all work together to provide you with the care you need, when you need it.  Your next appointment:   4 month(s)  Provider:   You may see Lanier Prude, MD or one of the following Advanced Practice Providers on your designated Care Team:   Francis Dowse, [place] [address]" [place], [place] Sherie Don, NP Canary Brim, NP

## 2022-12-28 NOTE — Telephone Encounter (Signed)
*  STAT* If patient is at the pharmacy, call can be transferred to refill team.   1. Which medications need to be refilled? (please list name of each medication and dose if known)   dofetilide (TIKOSYN) 500 MCG capsule    2. Would you like to learn more about the convenience, safety, & potential cost savings by using the [facility]?    3. Are you open to using the [facility] (Type [facility]. ).   4. Which pharmacy/location (including street and city if local pharmacy) is medication to be sent to? [facility]   5. Do they need a 30 day or 90 day supply? 90 day  Caller stated patient is completely out of this medication and wants prescription sento to [facility] today.

## 2023-01-23 ENCOUNTER — Other Ambulatory Visit (HOSPITAL_COMMUNITY): Payer: Self-pay

## 2023-01-23 DIAGNOSIS — G4733 Obstructive sleep apnea (adult) (pediatric): Secondary | ICD-10-CM | POA: Diagnosis not present

## 2023-01-25 ENCOUNTER — Other Ambulatory Visit (HOSPITAL_COMMUNITY): Payer: Self-pay

## 2023-01-28 ENCOUNTER — Other Ambulatory Visit: Payer: Self-pay | Admitting: Cardiology

## 2023-02-21 DIAGNOSIS — Z0000 Encounter for general adult medical examination without abnormal findings: Secondary | ICD-10-CM | POA: Diagnosis not present

## 2023-02-21 DIAGNOSIS — N183 Chronic kidney disease, stage 3 unspecified: Secondary | ICD-10-CM | POA: Diagnosis not present

## 2023-02-21 DIAGNOSIS — I10 Essential (primary) hypertension: Secondary | ICD-10-CM | POA: Diagnosis not present

## 2023-02-21 DIAGNOSIS — K580 Irritable bowel syndrome with diarrhea: Secondary | ICD-10-CM | POA: Diagnosis not present

## 2023-02-21 DIAGNOSIS — E782 Mixed hyperlipidemia: Secondary | ICD-10-CM | POA: Diagnosis not present

## 2023-02-23 DIAGNOSIS — E782 Mixed hyperlipidemia: Secondary | ICD-10-CM | POA: Diagnosis not present

## 2023-02-23 DIAGNOSIS — D649 Anemia, unspecified: Secondary | ICD-10-CM | POA: Diagnosis not present

## 2023-02-23 DIAGNOSIS — I10 Essential (primary) hypertension: Secondary | ICD-10-CM | POA: Diagnosis not present

## 2023-02-23 DIAGNOSIS — Z125 Encounter for screening for malignant neoplasm of prostate: Secondary | ICD-10-CM | POA: Diagnosis not present

## 2023-02-23 DIAGNOSIS — N183 Chronic kidney disease, stage 3 unspecified: Secondary | ICD-10-CM | POA: Diagnosis not present

## 2023-03-07 ENCOUNTER — Other Ambulatory Visit: Payer: Self-pay | Admitting: Cardiology

## 2023-03-07 DIAGNOSIS — G4733 Obstructive sleep apnea (adult) (pediatric): Secondary | ICD-10-CM

## 2023-03-07 DIAGNOSIS — I10 Essential (primary) hypertension: Secondary | ICD-10-CM

## 2023-03-07 DIAGNOSIS — I4891 Unspecified atrial fibrillation: Secondary | ICD-10-CM

## 2023-03-07 NOTE — Telephone Encounter (Signed)
Prescription refill request for Eliquis received. Indication:afib Last office visit:7/24 Scr:1.42  7/24 Age: 67 Weight:131.5  kg  Prescription refilled

## 2023-03-24 ENCOUNTER — Telehealth: Payer: Self-pay | Admitting: Cardiology

## 2023-03-24 NOTE — Telephone Encounter (Signed)
I spoke with patient. He reports he was previously on lisinopril but this was stopped by Dr Shari Prows.  Patient does not remember dose he was taking.  Chart reviewed and lisinopril was stopped in 03/01/21 phone note.  Patient reports his PCP recommends he start lisinopril 2.5 mg daily to protect his kidneys from protein build up.  Patient does not know his BP but states it was good at recent office visit with PCP. Patient wanted to make sure it was OK with Dr Lalla Brothers to start lisinopril.

## 2023-03-24 NOTE — Telephone Encounter (Signed)
Pt c/o medication issue:  1. Name of Medication: Lisinopril  2. How are you currently taking this medication (dosage and times per day)?   3. Are you having a reaction (difficulty breathing--STAT)?   4. What is your medication issue? Pt states that another provider is wanting to put him on medication because he has too much protein in his urine. He wants to know if it is ok to take this medication.

## 2023-03-27 NOTE — Telephone Encounter (Signed)
Attempted to call patient. Unable to leave voicemail.  

## 2023-03-28 NOTE — Telephone Encounter (Signed)
Patient is returning phone call.  °

## 2023-03-28 NOTE — Telephone Encounter (Signed)
Left message for patient with Dr. Geannie Risen recommendations. Advised to call back with any questions.

## 2023-03-28 NOTE — Telephone Encounter (Signed)
Left message for patient to call back  

## 2023-04-18 ENCOUNTER — Other Ambulatory Visit: Payer: Self-pay | Admitting: Cardiology

## 2023-04-20 ENCOUNTER — Telehealth: Payer: Self-pay | Admitting: Pharmacy Technician

## 2023-04-20 ENCOUNTER — Other Ambulatory Visit (HOSPITAL_COMMUNITY): Payer: Self-pay

## 2023-04-20 NOTE — Telephone Encounter (Signed)
Pharmacy Patient Advocate Encounter   Received notification from CoverMyMeds that prior authorization for dofetilide is required/requested.   Insurance verification completed.   The patient is insured through [employer] [facility] .   Per test claim: PA required; PA submitted to above mentioned insurance via CoverMyMeds Key/confirmation #/EOC [id number] Status is pending

## 2023-04-21 NOTE — Telephone Encounter (Signed)
Pharmacy Patient Advocate Encounter  Received notification from [employer] [facility] that Prior Authorization for dofetilide has been APPROVED from 04/20/23 to 04/19/24   PA #/Case ID/Reference #: [id number] NL

## 2023-05-16 ENCOUNTER — Ambulatory Visit: Payer: BC Managed Care – PPO | Admitting: Cardiology

## 2023-05-18 NOTE — Progress Notes (Signed)
Cardiology Office Note Date:  05/18/2023  Patient ID:  Jesse, Rice [birth year], MRN [id number] PCP:  Soundra Pilon, FNP  Cardiologist:  Dr. Shari Prows (inactive) Electrophysiologist: Dr. Lalla Brothers     Chief Complaint:  Tikosyn visit  History of Present Illness: Jesse Rice is a 67 y.o. male with history of HTN, rate related LBBB, Afib, dilated Ao root.  He las saw cardiology team by Wende Mott, PA, 02/26/21, was feeling well post DCCV.  He was back in AFib and unaware, referred to EP for rhythm management options. Planned for CT in 2023 to monitor his Aorta and an echo in a couple years to follow his MR. Pending sleep study  I saw him 01/04/22 He comes today to be seen for Dr. Lalla Brothers, last seen by him 09/03/21, he would check his rhythm occasionally noting no AFib.  Did have some episode of fatigue/reduced exertional capacity with plans to resume his iron supplements Was maintaining SR, planned to continue dofetilide at least a year post ablation  Qtc was stable No changes made  He saw Dr. Lalla Brothers 05/11/22, doing well, stable QTc, noting LBBB, suspect rate related, planned for echo  Echo with LVEF 65-70%, mild-mod MR  I saw him 08/10/22 He comes with his wife today He feels well Denies any CP, palpitations or cardiac awareness. No SOB No near syncope or syncope. No bleeding or signs of bleeding He is not using his CPAP regularly, uncomfortable They ask if his frequency of visits/EKGs can be reduced for cost savings. No changes made Planned for q4 mo visits  Saw Dr. Lalla Brothers 12/28/22, doing well, low arrhythmia burden, stable QTc, no changes made  TODAY He is doing very well No CP, palpitations Does not think he has had any AFib now since his ablation/Tiksoyn. No SOB, DOE Job is physical and denies any intolerances No near syncope or syncope. No bleeding/signs of bleeding  Notes that when he is sitting for any real duration at home, tends to nod off Does have  apnea, but needs new/replacement equipment, has been trying to get to communicate with his supply company but has not been successful  Afib/AAD hx Diagnosed June 2022 TEE/DCCV 12/23/20 Tikosyn started Nov 2022 PVI ablation 05/28/21   Past Medical History:  Diagnosis Date   Atrial fibrillation (HCC)    s/p DCCV in 11/2020   Dilated aortic root (HCC)    Echo 7/22: 41 mm   Hypertension    Hypertensive pulmonary venous disease (HCC) 05/20/2014   IBS (irritable bowel syndrome)    LBBB (left bundle branch block)    rate related    Past Surgical History:  Procedure Laterality Date   ATRIAL FIBRILLATION ABLATION N/A 05/28/2021   Procedure: ATRIAL FIBRILLATION ABLATION;  Surgeon: Lanier Prude, MD;  Location: [facility] INVASIVE CV LAB;  Service: Cardiovascular;  Laterality: N/A;   CARDIOVERSION N/A 12/23/2020   Procedure: CARDIOVERSION;  Surgeon: Meriam Sprague, MD;  Location: [facility] ENDOSCOPY;  Service: Cardiovascular;  Laterality: N/A;   CARDIOVERSION N/A 03/04/2021   Procedure: CARDIOVERSION;  Surgeon: Thomasene Ripple, DO;  Location: [facility] ENDOSCOPY;  Service: Cardiovascular;  Laterality: N/A;   CARDIOVERSION N/A 04/28/2021   Procedure: CARDIOVERSION;  Surgeon: Meriam Sprague, MD;  Location: [facility] ENDOSCOPY;  Service: Cardiovascular;  Laterality: N/A;   CHOLECYSTECTOMY     TEE WITHOUT CARDIOVERSION N/A 12/23/2020   Procedure: TRANSESOPHAGEAL ECHOCARDIOGRAM (TEE);  Surgeon: Meriam Sprague, MD;  Location: [facility] ENDOSCOPY;  Service: Cardiovascular;  Laterality: N/A;  Current Outpatient Medications  Medication Sig Dispense Refill   atorvastatin (LIPITOR) 10 MG tablet Take 1 tablet (10 mg total) by mouth daily. 90 tablet 3   diphenoxylate-atropine (LOMOTIL) 2.5-0.025 MG tablet Take 1 tablet by mouth 4 (four) times daily as needed for diarrhea or loose stools.     dofetilide (TIKOSYN) 500 MCG capsule Take 1 capsule (500 mcg total) by mouth 2 (two) times daily. 180 capsule 2   ELIQUIS 5 MG  TABS tablet TAKE 1 TABLET TWICE A DAY 180 tablet 1   ferrous sulfate 325 (65 FE) MG tablet Take 1 tablet by mouth daily. (Patient not taking: Reported on 12/28/2022)     metoprolol succinate (TOPROL-XL) 50 MG 24 hr tablet TAKE 1 TABLET IN THE       MORNING AND AT BEDTIME WITHOR IMMEDIATELY FOLLOWING   MEALS 180 tablet 2   No current facility-administered medications for this visit.    Allergies:   Patient has no allergy information on record.   Social History:  The patient  reports that he has quit smoking. His smoking use included cigarettes. He has never used smokeless tobacco. He reports that he does not drink alcohol and does not use drugs.   Family History:  The patient's family history includes Arrhythmia in his mother; COPD in his father; Diabetes in his father; Heart murmur in his father; Hypertension in his father and mother; Rheumatic fever in his father.  ROS:  Please see the history of present illness.    All other systems are reviewed and otherwise negative.   PHYSICAL EXAM:  VS:  There were no vitals taken for this visit. BMI: There is no height or weight on file to calculate BMI. Well nourished, well developed, in no acute distress HEENT: normocephalic, atraumatic Neck: no JVD, carotid bruits or masses Cardiac:  RRR; no significant murmurs, no rubs, or gallops Lungs: CTA b/l, no wheezing, rhonchi or rales Abd: soft, nontender MS: no deformity or atrophy Ext: no edema Skin: warm and dry, no rash Neuro:  No gross deficits appreciated Psych: euthymic mood, full affect   EKG:  Done today and reviewed by myself shows  SR 65bpm, LBBB (old), QTc , (not accounting for QRS )  06/16/22: TTE 1. Left ventricular ejection fraction, by estimation, is 65 to 70%. Left  ventricular ejection fraction by PLAX is 66 %. The left ventricle has  normal function. The left ventricle has no regional wall motion  abnormalities. There is mild left ventricular  hypertrophy. Left  ventricular diastolic parameters were normal.   2. Right ventricular systolic function is normal. The right ventricular  size is normal. Tricuspid regurgitation signal is inadequate for assessing  PA pressure.   3. Left atrial size was moderately dilated.   4. Right atrial size was moderately dilated.   5. The mitral valve is abnormal. Mild mitral valve regurgitation.   6. The aortic valve is tricuspid. Aortic valve regurgitation is not  visualized.   7. Aortic dilatation noted. There is mild dilatation of the ascending  aorta, measuring 41 mm.   8. The inferior vena cava is normal in size with greater than 50%  respiratory variability, suggesting right atrial pressure of 3 mmHg.   Comparison(s): Changes from prior study are noted. 12/17/2020: LVEF 60-65%,  mild to moderate MR.     12/21/21: CT angio chest IMPRESSION: 1. No thoracic aorta abnormality. 2. No acute intrathoracic abnormality.  05/18/2021: EPS/ablation CONCLUSIONS: 1. Successful PVI 2. Successful ablation/isolation of the posterior wall  3. Intracardiac echo reveals trivial to small pericardial effusion, left-sided common ostium, normal left ventricular function 4. No early apparent complications. 5.  Colchicine 0.6 mg by mouth twice daily for 5 days 6.  Protonix 40 mg by mouth once daily for 45 days  05/20/21: Cardiac CT IMPRESSION: 1. There is normal pulmonary vein drainage into the left atrium. Common ostium left sided pulmonary veins. Measurements as reported 2.  There is no thrombus in the left atrial appendage. 3. Esophagus courses posterior to ostium of left sided pulmonary veins 4.  Coronary calcium score 74 (55th percentile)    12/23/2020: TEE  1. Left ventricular ejection fraction, by estimation, is 60 to 65%. The  left ventricle has normal function.   2. Right ventricular systolic function is normal. The right ventricular  size is normal.   3. No left atrial/left atrial appendage thrombus was  detected.   4. The mitral valve is normal in structure. There is mild-to-moderate vs  moderate mitral regurgitation with multiple jets seen along the coaptation  line. Degree of MR varied with blood pressure during the study. No  evidence of mitral stenosis.   5. The aortic valve is tricuspid. There is mild thickening of the aortic  valve. Aortic valve regurgitation is not visualized. Mild aortic valve  sclerosis is present, with no evidence of aortic valve stenosis.   6. There is mild (Grade II) plaque.    TEE 12/23/20 EF 60-65, normal RVSF, no LAA clot, mild to mod MR, AV sclerosis   Echocardiogram 12/17/20 EF 60-65, no RWMA, normal RVSF, mild to mod MR, Ao root 41 mm  Recent Labs: 12/28/2022: BUN 14; Creatinine, Ser 1.42; Magnesium 2.1; Potassium 4.4; Sodium 143  No results found for requested labs within last 365 days.   CrCl cannot be calculated (Patient's most recent lab result is older than the maximum 21 days allowed.).   Wt Readings from Last 3 Encounters:  12/28/22 289 lb 12.8 oz (131.5 kg)  08/10/22 284 lb 9.6 oz (129.1 kg)  05/11/22 284 lb (128.8 kg)     Other studies reviewed: Additional studies/records reviewed today include: summarized above  ASSESSMENT AND PLAN:  Persistent Afib CHA2DS2Vasc is one, on Eliquis, appropriately dosed Tikosyn with stable QTc Low/no burden by symptoms Labs today meds reviewed Tikosyn teaching re-enforced  Spoke to sleep team MA, given him the re-supply number as well as her number to assist him if he needs it   HTN Looks great, on lisinopril now   3. LBBB Historically rate related by chart notes Continues Preserved LVEF No symptoms of HF or CAD Minimal Ca on his CT scan last year   4. Secondary hypercoagulable state    Disposition: back in 4 mo sooner if needed   Current medicines are reviewed at length with the patient today.  The patient did not have any concerns regarding medicines.  Norma Fredrickson,  PA-C 05/18/2023 1:13 PM     [facility] [address] [place] [address] [phone] (office)  [phone] (fax)

## 2023-05-19 ENCOUNTER — Encounter: Payer: Self-pay | Admitting: Physician Assistant

## 2023-05-19 ENCOUNTER — Ambulatory Visit: Payer: BC Managed Care – PPO | Attending: Physician Assistant | Admitting: Physician Assistant

## 2023-05-19 VITALS — BP 126/60 | HR 65 | Ht 76.0 in | Wt 287.8 lb

## 2023-05-19 DIAGNOSIS — I10 Essential (primary) hypertension: Secondary | ICD-10-CM | POA: Diagnosis not present

## 2023-05-19 DIAGNOSIS — Z79899 Other long term (current) drug therapy: Secondary | ICD-10-CM

## 2023-05-19 DIAGNOSIS — G4739 Other sleep apnea: Secondary | ICD-10-CM

## 2023-05-19 DIAGNOSIS — I447 Left bundle-branch block, unspecified: Secondary | ICD-10-CM

## 2023-05-19 DIAGNOSIS — D6869 Other thrombophilia: Secondary | ICD-10-CM

## 2023-05-19 DIAGNOSIS — Z5181 Encounter for therapeutic drug level monitoring: Secondary | ICD-10-CM

## 2023-05-19 DIAGNOSIS — I4819 Other persistent atrial fibrillation: Secondary | ICD-10-CM | POA: Diagnosis not present

## 2023-05-19 NOTE — Patient Instructions (Signed)
Medication Instructions:   Your physician recommends that you continue on your current medications as directed. Please refer to the Current Medication list given to you today.   *If you need a refill on your cardiac medications before your next appointment, please call your pharmacy*   Lab Work:   PLEASE GO DOWN STAIRS FIRST FLOOR  [address] :   BMET MAG AND CBC TODAY     If you have labs (blood work) drawn today and your tests are completely normal, you will receive your results only by: MyChart Message (if you have MyChart) OR A paper copy in the mail If you have any lab test that is abnormal or we need to change your treatment, we will call you to review the results.    Testing/Procedures: NONE ORDERED  TODAY    Follow-Up: At [facility], you and your health needs are our priority.  As part of our continuing mission to provide you with exceptional heart care, we have created designated Provider Care Teams.  These Care Teams include your primary Cardiologist (physician) and Advanced Practice Providers (APPs -  Physician Assistants and Nurse Practitioners) who all work together to provide you with the care you need, when you need it.  We recommend signing up for the patient portal called "MyChart".  Sign up information is provided on this After Visit Summary.  MyChart is used to connect with patients for Virtual Visits (Telemedicine).  Patients are able to view lab/test results, encounter notes, upcoming appointments, etc.  Non-urgent messages can be sent to your provider as well.   To learn more about what you can do with MyChart, go to [email or web address].    Your next appointment:    4 month(s)  ( CONTACT  CASSIE HALL/ ANGELINE HAMMER FOR EP SCHEDULING ISSUES )   Provider:    You may see Lanier Prude, MD or one of the following Advanced Practice Providers on your designated Care Team:   Maryella Shivers  Other Instructions  [phone] Sleep  Supplies   [phone] [facility] Sleep Cordinator

## 2023-05-20 LAB — CBC
Hematocrit: 39.7 % (ref 37.5–51.0)
Hemoglobin: 13.4 g/dL (ref 13.0–17.7)
MCH: 31 pg (ref 26.6–33.0)
MCHC: 33.8 g/dL (ref 31.5–35.7)
MCV: 92 fL (ref 79–97)
Platelets: 253 10*3/uL (ref 150–450)
RBC: 4.32 x10E6/uL (ref 4.14–5.80)
RDW: 12 % (ref 11.6–15.4)
WBC: 10.2 10*3/uL (ref 3.4–10.8)

## 2023-05-20 LAB — BASIC METABOLIC PANEL
BUN/Creatinine Ratio: 11 (ref 10–24)
BUN: 14 mg/dL (ref 8–27)
CO2: 24 mmol/L (ref 20–29)
Calcium: 9.6 mg/dL (ref 8.6–10.2)
Chloride: 103 mmol/L (ref 96–106)
Creatinine, Ser: 1.25 mg/dL (ref 0.76–1.27)
Glucose: 102 mg/dL — ABNORMAL HIGH (ref 70–99)
Potassium: 4.9 mmol/L (ref 3.5–5.2)
Sodium: 142 mmol/L (ref 134–144)
eGFR: 64 mL/min/{1.73_m2} (ref >59)

## 2023-05-20 LAB — MAGNESIUM: Magnesium: 2.1 mg/dL (ref 1.6–2.3)

## 2023-06-08 DIAGNOSIS — G4733 Obstructive sleep apnea (adult) (pediatric): Secondary | ICD-10-CM | POA: Diagnosis not present

## 2023-06-22 DIAGNOSIS — Z20822 Contact with and (suspected) exposure to covid-19: Secondary | ICD-10-CM | POA: Diagnosis not present

## 2023-06-22 DIAGNOSIS — J029 Acute pharyngitis, unspecified: Secondary | ICD-10-CM | POA: Diagnosis not present

## 2023-06-22 DIAGNOSIS — R059 Cough, unspecified: Secondary | ICD-10-CM | POA: Diagnosis not present

## 2023-06-22 DIAGNOSIS — R509 Fever, unspecified: Secondary | ICD-10-CM | POA: Diagnosis not present

## 2023-07-10 ENCOUNTER — Encounter: Payer: Self-pay | Admitting: Cardiology

## 2023-07-10 ENCOUNTER — Ambulatory Visit: Payer: BC Managed Care – PPO | Attending: Cardiology | Admitting: Cardiology

## 2023-07-10 VITALS — BP 142/82 | HR 71 | Ht 76.0 in | Wt 287.2 lb

## 2023-07-10 DIAGNOSIS — I10 Essential (primary) hypertension: Secondary | ICD-10-CM | POA: Diagnosis not present

## 2023-07-10 DIAGNOSIS — G4733 Obstructive sleep apnea (adult) (pediatric): Secondary | ICD-10-CM | POA: Diagnosis not present

## 2023-07-10 NOTE — Patient Instructions (Signed)
 Medication Instructions:  Your physician recommends that you continue on your current medications as directed. Please refer to the Current Medication list given to you today.  *If you need a refill on your cardiac medications before your next appointment, please call your pharmacy*   Lab Work: None.  If you have labs (blood work) drawn today and your tests are completely normal, you will receive your results only by: MyChart Message (if you have MyChart) OR A paper copy in the mail If you have any lab test that is abnormal or we need to change your treatment, we will call you to review the results.   Testing/Procedures: None.   Follow-Up: At [facility], you and your health needs are our priority.  As part of our continuing mission to provide you with exceptional heart care, we have created designated Provider Care Teams.  These Care Teams include your primary Cardiologist (physician) and Advanced Practice Providers (APPs -  Physician Assistants and Nurse Practitioners) who all work together to provide you with the care you need, when you need it.  We recommend signing up for the patient portal called "MyChart".  Sign up information is provided on this After Visit Summary.  MyChart is used to connect with patients for Virtual Visits (Telemedicine).  Patients are able to view lab/test results, encounter notes, upcoming appointments, etc.  Non-urgent messages can be sent to your provider as well.   To learn more about what you can do with MyChart, go to [email or web address].    Your next appointment:   1 year(s)  Provider:   Dr. Armanda Magic, MD

## 2023-07-10 NOTE — Progress Notes (Signed)
 Sleep Medicine CONSULT Note    Date:  07/10/2023   ID:  CAZ WESTERMEYER, DOB 1955/07/17, MRN [id number]  PCP:  Alejandro Hurt, FNP  Cardiologist: Sonny Dust, MD (Inactive)   Chief Complaint  Patient presents with   New Patient (Initial Visit)    OSA    History of Present Illness:  Jesse Rice is a 68 y.o. male who is being seen today for the evaluation of OSA at the request of Riccardo Chamberlain, MD.  This is a 68yo male with a hx of PAF followed by EP, HTN, LBBB who underwent HST 02-2021 due to PAF and was found to have moderate to severe OSA with an AHI of 28.8/hr and nocturnal hypoxemia with O2 sats as low as 81%.  He underwent CPAP titration to 11cm H2O and was started on CPAP in Jan 2023.  He is now referred for Sleep medicine consult to establish sleep care.  He is doing well with his PAP device and thinks that he has gotten used to it.  He tolerates the full face mask but says that he has a hard time getting a seal on it. He has a mustache and goute and suspect that the facial hair is preventing a good seal. He feels the pressure is adequate.  Since going on PAP he feels more rested in the am and has no significant daytime sleepiness.  He denies any significant mouth or nasal dryness or nasal congestion.  He does not think that he snores.    Past Medical History:  Diagnosis Date   Atrial fibrillation (HCC)    s/p DCCV in 11/2020   Dilated aortic root (HCC)    Echo 7/22: 41 mm   Hypertension    Hypertensive pulmonary venous disease (HCC) 05/20/2014   IBS (irritable bowel syndrome)    LBBB (left bundle branch block)    rate related   OSA on CPAP    moderate to severe OSA with an AHI of 28.8/hr and nocturnal hypoxemia with O2 sats as low as 81%  On CPAP at 11cm H2O    Past Surgical History:  Procedure Laterality Date   ATRIAL FIBRILLATION ABLATION N/A 05/28/2021   Procedure: ATRIAL FIBRILLATION ABLATION;  Surgeon: Boyce Byes, MD;  [facility]: [facility]  INVASIVE CV LAB;  Service: Cardiovascular;  Laterality: N/A;   CARDIOVERSION N/A 12/23/2020   Procedure: CARDIOVERSION;  Surgeon: Sonny Dust, MD;  Location: [facility] ENDOSCOPY;  Service: Cardiovascular;  Laterality: N/A;   CARDIOVERSION N/A 03/04/2021   Procedure: CARDIOVERSION;  Surgeon: Jerryl Morin, DO;  Location: [facility] ENDOSCOPY;  Service: Cardiovascular;  Laterality: N/A;   CARDIOVERSION N/A 04/28/2021   Procedure: CARDIOVERSION;  Surgeon: Sonny Dust, MD;  Location: [facility] ENDOSCOPY;  Service: Cardiovascular;  Laterality: N/A;   CHOLECYSTECTOMY     TEE WITHOUT CARDIOVERSION N/A 12/23/2020   Procedure: TRANSESOPHAGEAL ECHOCARDIOGRAM (TEE);  Surgeon: Sonny Dust, MD;  Location: [facility] ENDOSCOPY;  Service: Cardiovascular;  Laterality: N/A;    Current Medications: Current Meds  Medication Sig   diphenoxylate -atropine  (LOMOTIL ) 2.5-0.025 MG tablet Take 1 tablet by mouth 4 (four) times daily as needed for diarrhea or loose stools.   dofetilide  (TIKOSYN ) 500 MCG capsule Take 1 capsule (500 mcg total) by mouth 2 (two) times daily.   ELIQUIS  5 MG TABS tablet TAKE 1 TABLET TWICE A DAY   lisinopril (ZESTRIL) 2.5 MG tablet Take 2.5 mg by mouth daily.   metoprolol  succinate (TOPROL -XL) 50 MG 24 hr tablet  TAKE 1 TABLET IN THE       MORNING AND AT BEDTIME WITHOR IMMEDIATELY FOLLOWING   MEALS    Allergies:   Patient has no allergy information on record.   Social History   Socioeconomic History   Marital status: Single    Spouse name: Not on file   Number of children: Not on file   Years of education: Not on file   Highest education level: Not on file  Occupational History   Not on file  Tobacco Use   Smoking status: Former    Current packs/day: 0.50    Types: Cigarettes   Smokeless tobacco: Never   Tobacco comments:    Former smoker 05/06/2021  Substance and Sexual Activity   Alcohol use: No   Drug use: No   Sexual activity: Not on file  Other Topics Concern   Not on file   Social History Narrative   Not on file   Social Drivers of Health   Financial Resource Strain: Not on file  Food Insecurity: Not on file  Transportation Needs: Not on file  Physical Activity: Not on file  Stress: Not on file  Social Connections: Not on file     Family History:  The patient's family history includes Arrhythmia in his mother; COPD in his father; Diabetes in his father; Heart murmur in his father; Hypertension in his father and mother; Rheumatic fever in his father.   ROS:   Please see the history of present illness.    ROS All other systems reviewed and are negative.      No data to display             PHYSICAL EXAM:   VS:  BP (!) 142/82   Pulse 71   Ht 6\' 4"  (1.93 m)   Wt 287 lb 3.2 oz (130.3 kg)   SpO2 96%   BMI 34.96 kg/m    GEN: Well nourished, well developed, in no acute distress  HEENT: normal  Neck: no JVD, carotid bruits, or masses Cardiac: RRR; no murmurs, rubs, or gallops,no edema.  Intact distal pulses bilaterally.  Respiratory:  clear to auscultation bilaterally, normal work of breathing GI: soft, nontender, nondistended, + BS MS: no deformity or atrophy  Skin: warm and dry, no rash Neuro:  Alert and Oriented x 3, Strength and sensation are intact Psych: euthymic mood, full affect  Wt Readings from Last 3 Encounters:  07/10/23 287 lb 3.2 oz (130.3 kg)  05/19/23 287 lb 12.8 oz (130.5 kg)  12/28/22 289 lb 12.8 oz (131.5 kg)      Studies/Labs Reviewed:   HST, CPAP titration, PAP compliance download  Recent Labs: 05/19/2023: BUN 14; Creatinine, Ser 1.25; Hemoglobin 13.4; Magnesium  2.1; Platelets 253; Potassium 4.9; Sodium 142     Additional studies/ records that were reviewed today include:  none    ASSESSMENT:    1. OSA (obstructive sleep apnea)   2. Primary hypertension   3. OSA on CPAP      PLAN:  In order of problems listed above:  OSA - The patient is tolerating PAP therapy well without any problems. The PAP  download performed by his DME was personally reviewed and interpreted by me today and showed an AHI of 0.3/hr on 11 cm H2O with 10% compliance in using more than 4 hours nightly.  The patient has been using and benefiting from PAP use and will continue to benefit from therapy.  -I have encouraged him to try to use  his device nightly for at least 5 hours nightly -I will order him new supplies  HTN -BP controlled on exam today -continue prescription drug management with Toprol  XL 50mg  BID with PRN refills   Time Spent: 20 minutes total time of encounter, including 15 minutes spent in face-to-face patient care on the date of this encounter. This time includes coordination of care and counseling regarding above mentioned problem list. Remainder of non-face-to-face time involved reviewing chart documents/testing relevant to the patient encounter and documentation in the medical record. I have independently reviewed documentation from referring provider  Medication Adjustments/Labs and Tests Ordered: Current medicines are reviewed at length with the patient today.  Concerns regarding medicines are outlined above.  Medication changes, Labs and Tests ordered today are listed in the Patient Instructions below.  There are no Patient Instructions on file for this visit.   Signed, Gaylyn Keas, MD  07/10/2023 5:03 PM    [facility] [address] [place], [place], [place]  [address] Phone: [phone]; Fax: [phone]

## 2023-08-16 ENCOUNTER — Other Ambulatory Visit (HOSPITAL_COMMUNITY): Payer: Self-pay

## 2023-08-16 ENCOUNTER — Telehealth: Payer: Self-pay | Admitting: Cardiology

## 2023-08-16 MED ORDER — DOFETILIDE 500 MCG PO CAPS
500.0000 ug | ORAL_CAPSULE | Freq: Two times a day (BID) | ORAL | 0 refills | Status: DC
  Filled 2023-08-16 (×2): qty 6, 3d supply, fill #0

## 2023-08-16 NOTE — Telephone Encounter (Signed)
*  STAT* If patient is at the pharmacy, call can be transferred to refill team.   1. Which medications need to be refilled? (please list name of each medication and dose if known)  dofetilide (TIKOSYN) 500 MCG capsule  2. Which pharmacy/location (including street and city if local pharmacy) is medication to be sent to? [facility] Phone: [phone]  Fax: [phone]     3. Do they need a 30 day or 90 day supply? 3 day supply he is waiting on his mail order. Pt is out of medication

## 2023-08-16 NOTE — Telephone Encounter (Signed)
 Patient was returning phone call

## 2023-08-16 NOTE — Telephone Encounter (Signed)
 Pt is calling to let Dr. Lovena Neighbours RN know that he requested a refill of Tikosyn to be sent to his mail order and the mail order want be able to get this to him until tomorrow or the following day at latest.   He states he will be out of the medication by the delivery date and is wandering if our office could provide him samples of this medication, so that therapy isn't interrupted.   He states if he is unable to be provided samples of this, then we could send the refill to [facility] outpatient pharmacy.   Pt is aware that I will send this message now with high priority to Dr. Lovena Neighbours RN to follow-up with him about Tikosyn sample/refill.   I will also cc in afib clinic on this as a safety net for tikosyn samples, so that we can ensure therapy is not interrupted for the pt.

## 2023-08-16 NOTE — Telephone Encounter (Signed)
 3 day supply sent to community pharmacy.

## 2023-08-23 DIAGNOSIS — K580 Irritable bowel syndrome with diarrhea: Secondary | ICD-10-CM | POA: Diagnosis not present

## 2023-08-23 DIAGNOSIS — I4891 Unspecified atrial fibrillation: Secondary | ICD-10-CM | POA: Diagnosis not present

## 2023-08-23 DIAGNOSIS — I10 Essential (primary) hypertension: Secondary | ICD-10-CM | POA: Diagnosis not present

## 2023-08-23 DIAGNOSIS — E782 Mixed hyperlipidemia: Secondary | ICD-10-CM | POA: Diagnosis not present

## 2023-08-27 ENCOUNTER — Other Ambulatory Visit: Payer: Self-pay | Admitting: Cardiology

## 2023-08-27 DIAGNOSIS — I10 Essential (primary) hypertension: Secondary | ICD-10-CM

## 2023-08-27 DIAGNOSIS — I4891 Unspecified atrial fibrillation: Secondary | ICD-10-CM

## 2023-08-27 DIAGNOSIS — G4733 Obstructive sleep apnea (adult) (pediatric): Secondary | ICD-10-CM

## 2023-08-28 NOTE — Telephone Encounter (Signed)
 Prescription refill request for Eliquis received. Indication:afib Last office visit:2/25 Scr:1.25  12/24 Age: 68 Weight:130.3  kg  Prescription refilled

## 2023-09-06 ENCOUNTER — Ambulatory Visit: Payer: BC Managed Care – PPO | Admitting: Physician Assistant

## 2023-09-06 NOTE — Progress Notes (Unsigned)
 Cardiology Office Note Date:  09/06/2023  Patient ID:  Jesse Rice, Jesse Rice 04/30/1956, MRN [id number] PCP:  Soundra Pilon, FNP  Cardiologist:  Dr. Shari Prows (inactive) Electrophysiologist: Dr. Lalla Brothers     Chief Complaint:  *** Tikosyn visit  History of Present Illness: Jesse Rice is a 68 y.o. male with history of HTN, rate related LBBB, Afib, dilated Ao root.  He las saw cardiology team by Wende Mott, PA, 02/26/21, was feeling well post DCCV.  He was back in AFib and unaware, referred to EP for rhythm management options. Planned for CT in 2023 to monitor his Aorta and an echo in a couple years to follow his MR. Pending sleep study  Seeing myself and Dr. Lalla Brothers over the years  Saw Dr. Lalla Brothers 12/28/22, doing well, low arrhythmia burden, stable QTc, no changes made  I saw him 05/19/23 He is doing very well No CP, palpitations Does not think he has had any AFib now since his ablation/Tiksoyn. 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 Mayford Knife 07/10/23,  on CPAP feels better, tolerating well, facial hair preventing a good seal Noted 10% compliance in using more than 4 hours nightly.  The patient has been using and benefiting from PAP use and will continue to benefit from therapy.  - encouraged him to try to use his device nightly for at least 5 hours nightly - ordered him new supplies   TODAY  *** CPAP compliance? *** Tikosyn EKG, meds, labs, teaching  *** eliquis, dose, bleeding, labs, complaince  Afib/AAD hx Diagnosed June 2022 TEE/DCCV 12/23/20 Tikosyn started Nov 2022 PVI ablation 05/28/21   Past Medical History:  Diagnosis Date   Atrial fibrillation (HCC)    s/p DCCV in 11/2020   Dilated aortic root (HCC)    Echo 7/22: 41 mm   Hypertension    Hypertensive pulmonary venous disease (HCC) 05/20/2014   IBS (irritable  bowel syndrome)    LBBB (left bundle branch block)    rate related   OSA on CPAP    moderate to severe OSA with an AHI of 28.8/hr and nocturnal hypoxemia with O2 sats as low as 81%  On CPAP at 11cm H2O    Past Surgical History:  Procedure Laterality Date   ATRIAL FIBRILLATION ABLATION N/A 05/28/2021   Procedure: ATRIAL FIBRILLATION ABLATION;  Surgeon: Lanier Prude, MD;  Location: [facility] INVASIVE CV LAB;  Service: Cardiovascular;  Laterality: N/A;   CARDIOVERSION N/A 12/23/2020   Procedure: CARDIOVERSION;  Surgeon: Meriam Sprague, MD;  Location: [facility] ENDOSCOPY;  Service: Cardiovascular;  Laterality: N/A;   CARDIOVERSION N/A 03/04/2021   Procedure: CARDIOVERSION;  Surgeon: Thomasene Ripple, DO;  Location: [facility] ENDOSCOPY;  Service: Cardiovascular;  Laterality: N/A;   CARDIOVERSION N/A 04/28/2021   Procedure: CARDIOVERSION;  Surgeon: Meriam Sprague, MD;  Location: [facility] ENDOSCOPY;  Service: Cardiovascular;  Laterality: N/A;   CHOLECYSTECTOMY     TEE WITHOUT CARDIOVERSION N/A 12/23/2020   Procedure: TRANSESOPHAGEAL ECHOCARDIOGRAM (TEE);  Surgeon: Meriam Sprague, MD;  Location: [facility] ENDOSCOPY;  Service: Cardiovascular;  Laterality: N/A;    Current Outpatient Medications  Medication Sig Dispense Refill   atorvastatin (LIPITOR) 10 MG tablet Take 1 tablet (10 mg total) by mouth daily. 90 tablet 3   diphenoxylate-atropine (LOMOTIL) 2.5-0.025 MG tablet Take 1 tablet by mouth 4 (four) times daily as needed for  diarrhea or loose stools.     dofetilide (TIKOSYN) 500 MCG capsule Take 1 capsule (500 mcg total) by mouth 2 (two) times daily. 6 capsule 0   ELIQUIS 5 MG TABS tablet TAKE 1 TABLET TWICE A DAY 180 tablet 1   lisinopril (ZESTRIL) 2.5 MG tablet Take 2.5 mg by mouth daily.     metoprolol succinate (TOPROL-XL) 50 MG 24 hr tablet TAKE 1 TABLET IN THE       MORNING AND AT BEDTIME WITHOR IMMEDIATELY FOLLOWING   MEALS 180 tablet 2   No current facility-administered medications for this visit.     Allergies:   Patient has no allergy information on record.   Social History:  The patient  reports that he has quit smoking. His smoking use included cigarettes. He has never used smokeless tobacco. He reports that he does not drink alcohol and does not use drugs.   Family History:  The patient's family history includes Arrhythmia in his mother; COPD in his father; Diabetes in his father; Heart murmur in his father; Hypertension in his father and mother; Rheumatic fever in his father.  ROS:  Please see the history of present illness.    All other systems are reviewed and otherwise negative.   PHYSICAL EXAM:  VS:  There were no vitals taken for this visit. BMI: There is no height or weight on file to calculate BMI. Well nourished, well developed, in no acute distress HEENT: normocephalic, atraumatic Neck: no JVD, carotid bruits or masses Cardiac: *** RRR; no significant murmurs, no rubs, or gallops Lungs: *** CTA b/l, no wheezing, rhonchi or rales Abd: soft, nontender MS: no deformity or atrophy Ext: *** no edema Skin: warm and dry, no rash Neuro:  No gross deficits appreciated Psych: euthymic mood, full affect   EKG:  Done today and reviewed by myself shows  ***  05/19/23: SR 65bpm, LBBB (old), QTc , (not accounting for QRS )  06/16/22: TTE 1. Left ventricular ejection fraction, by estimation, is 65 to 70%. Left  ventricular ejection fraction by PLAX is 66 %. The left ventricle has  normal function. The left ventricle has no regional wall motion  abnormalities. There is mild left ventricular  hypertrophy. Left ventricular diastolic parameters were normal.   2. Right ventricular systolic function is normal. The right ventricular  size is normal. Tricuspid regurgitation signal is inadequate for assessing  PA pressure.   3. Left atrial size was moderately dilated.   4. Right atrial size was moderately dilated.   5. The mitral valve is abnormal. Mild mitral valve  regurgitation.   6. The aortic valve is tricuspid. Aortic valve regurgitation is not  visualized.   7. Aortic dilatation noted. There is mild dilatation of the ascending  aorta, measuring 41 mm.   8. The inferior vena cava is normal in size with greater than 50%  respiratory variability, suggesting right atrial pressure of 3 mmHg.   Comparison(s): Changes from prior study are noted. 12/17/2020: LVEF 60-65%,  mild to moderate MR.     12/21/21: CT angio chest IMPRESSION: 1. No thoracic aorta abnormality. 2. No acute intrathoracic abnormality.  05/18/2021: EPS/ablation CONCLUSIONS: 1. Successful PVI 2. Successful ablation/isolation of the posterior wall 3. Intracardiac echo reveals trivial to small pericardial effusion, left-sided common ostium, normal left ventricular function 4. No early apparent complications. 5.  Colchicine 0.6 mg by mouth twice daily for 5 days 6.  Protonix 40 mg by mouth once daily for 45 days  05/20/21: Cardiac CT  IMPRESSION: 1. There is normal pulmonary vein drainage into the left atrium. Common ostium left sided pulmonary veins. Measurements as reported 2.  There is no thrombus in the left atrial appendage. 3. Esophagus courses posterior to ostium of left sided pulmonary veins 4.  Coronary calcium score 74 (55th percentile)    12/23/2020: TEE  1. Left ventricular ejection fraction, by estimation, is 60 to 65%. The  left ventricle has normal function.   2. Right ventricular systolic function is normal. The right ventricular  size is normal.   3. No left atrial/left atrial appendage thrombus was detected.   4. The mitral valve is normal in structure. There is mild-to-moderate vs  moderate mitral regurgitation with multiple jets seen along the coaptation  line. Degree of MR varied with blood pressure during the study. No  evidence of mitral stenosis.   5. The aortic valve is tricuspid. There is mild thickening of the aortic  valve. Aortic valve  regurgitation is not visualized. Mild aortic valve  sclerosis is present, with no evidence of aortic valve stenosis.   6. There is mild (Grade II) plaque.    TEE 12/23/20 EF 60-65, normal RVSF, no LAA clot, mild to mod MR, AV sclerosis   Echocardiogram 12/17/20 EF 60-65, no RWMA, normal RVSF, mild to mod MR, Ao root 41 mm  Recent Labs: 05/19/2023: BUN 14; Creatinine, Ser 1.25; Hemoglobin 13.4; Magnesium 2.1; Platelets 253; Potassium 4.9; Sodium 142  No results found for requested labs within last 365 days.   CrCl cannot be calculated (Patient's most recent lab result is older than the maximum 21 days allowed.).   Wt Readings from Last 3 Encounters:  07/10/23 287 lb 3.2 oz (130.3 kg)  05/19/23 287 lb 12.8 oz (130.5 kg)  12/28/22 289 lb 12.8 oz (131.5 kg)     Other studies reviewed: Additional studies/records reviewed today include: summarized above  ASSESSMENT AND PLAN:  Persistent Afib CHA2DS2Vasc is one, on Eliquis, *** appropriately dosed Tikosyn with *** stable QTc *** Low/no burden by symptoms *** Labs today *** meds reviewed *** Tikosyn teaching re-enforced    HTN ***   3. LBBB Historically rate related by chart notes Preserved LVEF *** No symptoms of HF or CAD Minimal Ca on his CT scan last year   4. Secondary hypercoagulable state    Disposition: back in *** sooner if needed   Current medicines are reviewed at length with the patient today.  The patient did not have any concerns regarding medicines.  Norma Fredrickson, PA-C 09/06/2023 10:08 AM     [facility] [address] [place] [address] [phone] (office)  [phone] (fax)

## 2023-09-07 ENCOUNTER — Encounter: Payer: Self-pay | Admitting: Physician Assistant

## 2023-09-07 ENCOUNTER — Ambulatory Visit: Attending: Physician Assistant | Admitting: Physician Assistant

## 2023-09-07 VITALS — BP 120/74 | HR 76 | Resp 16 | Ht 76.0 in | Wt 278.0 lb

## 2023-09-07 DIAGNOSIS — I4819 Other persistent atrial fibrillation: Secondary | ICD-10-CM

## 2023-09-07 DIAGNOSIS — I10 Essential (primary) hypertension: Secondary | ICD-10-CM

## 2023-09-07 DIAGNOSIS — Z79899 Other long term (current) drug therapy: Secondary | ICD-10-CM | POA: Diagnosis not present

## 2023-09-07 DIAGNOSIS — D6869 Other thrombophilia: Secondary | ICD-10-CM

## 2023-09-07 DIAGNOSIS — I447 Left bundle-branch block, unspecified: Secondary | ICD-10-CM | POA: Diagnosis not present

## 2023-09-07 DIAGNOSIS — Z5181 Encounter for therapeutic drug level monitoring: Secondary | ICD-10-CM | POA: Diagnosis not present

## 2023-09-07 NOTE — Patient Instructions (Signed)
 Medication Instructions:   Your physician recommends that you continue on your current medications as directed. Please refer to the Current Medication list given to you today.   *If you need a refill on your cardiac medications before your next appointment, please call your pharmacy*    Lab Work:  PLEASE GO DOWN STAIRS  [facility]  FIRST FLOOR  [address] ( GET OFF ELEVATORS MAKE A LEFT AND ANOTHER LEFT LAB ON RIGHT DOWN HALLWAY : BMET AND MAG TODAY     If you have labs (blood work) drawn today and your tests are completely normal, you will receive your results only by: MyChart Message (if you have MyChart) OR A paper copy in the mail If you have any lab test that is abnormal or we need to change your treatment, we will call you to review the results.   Testing/Procedures: NONE ORDERED  TODAY      Follow-Up: At [facility], you and your health needs are our priority.  As part of our continuing mission to provide you with exceptional heart care, our providers are all part of one team.  This team includes your primary Cardiologist (physician) and Advanced Practice Providers or APPs (Physician Assistants and Nurse Practitioners) who all work together to provide you with the care you need, when you need it.  Your next appointment:     4 month(s) ( CONTACT  CASSIE HALL/ ANGELINE HAMMER FOR EP SCHEDULING ISSUES )   Provider:    You may see Lanier Prude, MD or one of the following Advanced Practice Providers on your designated Care Team:   Francis Dowse, [place]     We recommend signing up for the patient portal called "MyChart".  Sign up information is provided on this After Visit Summary.  MyChart is used to connect with patients for Virtual Visits (Telemedicine).  Patients are able to view lab/test results, encounter notes, upcoming appointments, etc.  Non-urgent messages can be sent to your provider as well.   To learn more about what you can do with MyChart, go to  [email or web address].   Other Instructions       1st Floor: - Lobby - Registration  - Pharmacy  - Lab - Cafe  2nd Floor: - PV Lab - Diagnostic Testing (echo, CT, nuclear med)  3rd Floor: - Vacant  4th Floor: - TCTS (cardiothoracic surgery) - AFib Clinic - Structural Heart Clinic - Vascular Surgery  - Vascular Ultrasound  5th Floor: - HeartCare Cardiology (general and EP) - Clinical Pharmacy for coumadin, hypertension, lipid, weight-loss medications, and med management appointments    Valet parking services will be available as well.

## 2023-09-08 LAB — BASIC METABOLIC PANEL WITH GFR
BUN/Creatinine Ratio: 7 — ABNORMAL LOW (ref 10–24)
BUN: 10 mg/dL (ref 8–27)
CO2: 24 mmol/L (ref 20–29)
Calcium: 9.4 mg/dL (ref 8.6–10.2)
Chloride: 105 mmol/L (ref 96–106)
Creatinine, Ser: 1.37 mg/dL — ABNORMAL HIGH (ref 0.76–1.27)
Glucose: 90 mg/dL (ref 70–99)
Potassium: 4.8 mmol/L (ref 3.5–5.2)
Sodium: 142 mmol/L (ref 134–144)
eGFR: 57 mL/min/{1.73_m2} — ABNORMAL LOW (ref >59)

## 2023-09-08 LAB — MAGNESIUM: Magnesium: 2.1 mg/dL (ref 1.6–2.3)

## 2023-10-24 ENCOUNTER — Other Ambulatory Visit: Payer: Self-pay | Admitting: Cardiology

## 2023-11-14 IMAGING — CT CT HEART MORPH/PULM VEIN W/ CM & W/O CA SCORE
2 of 6 series · 12 of 20 positions shown, 14 images · IV contrast (Omni 300)
Comparison: 04/28/2014 chest radiograph
COMPARISON: 04/28/2014 chest radiograph

Addendum:
EXAM:
OVER-READ INTERPRETATION  CT CHEST

The following report is an over-read performed by radiologist Dr.
over-read does not include interpretation of cardiac or coronary
anatomy or pathology. The coronary CTA interpretation by the
cardiologist is attached.
TECHNIQUE: The patient was scanned on a Siemens Somatom scanner.

[Series 9: 0-90% · axial · 0.43mm/px · z∈[+1327,+1423]mm · 6 of 2690 slices shown, 8 images]
[im 385/2690  vessel]
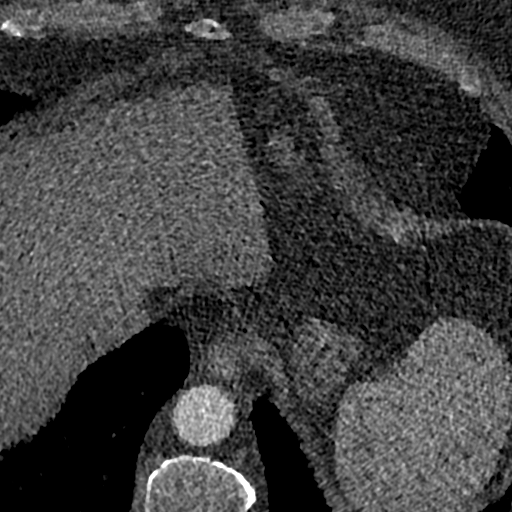
[im 385/2690  lung]
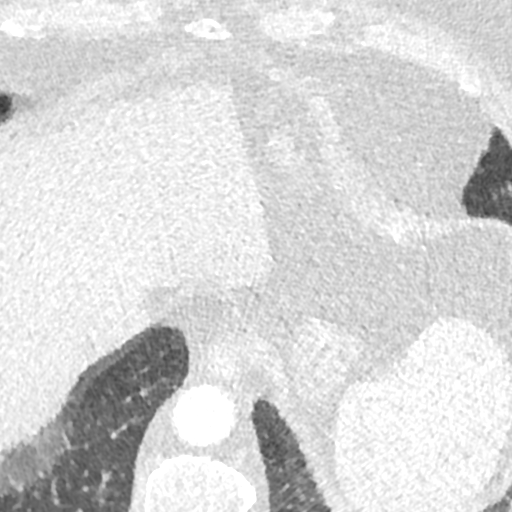
[im 769/2690  vessel]
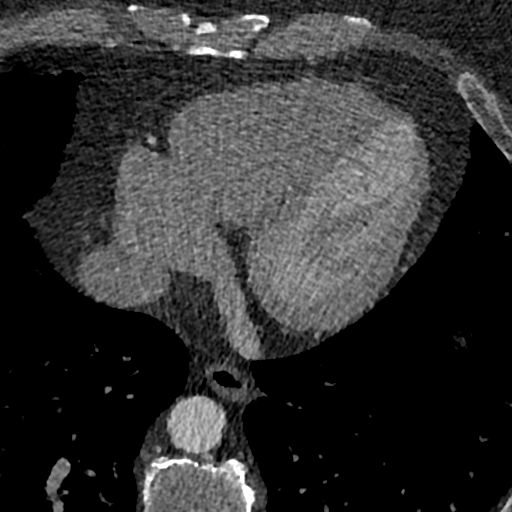
[im 1153/2690  vessel]
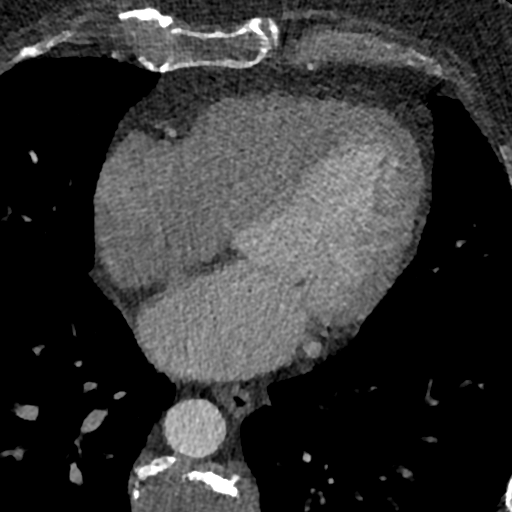
[im 1537/2690  vessel]
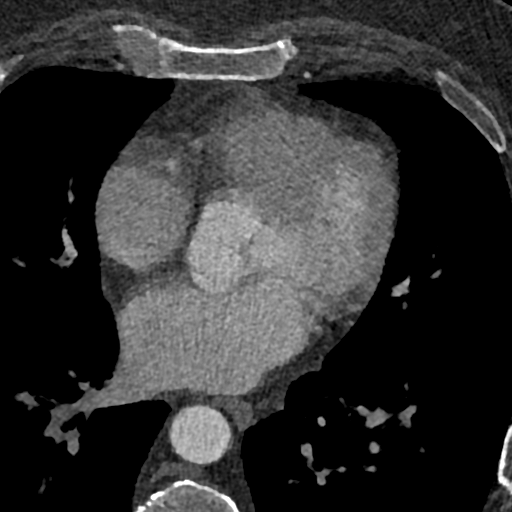
[im 1921/2690  vessel]
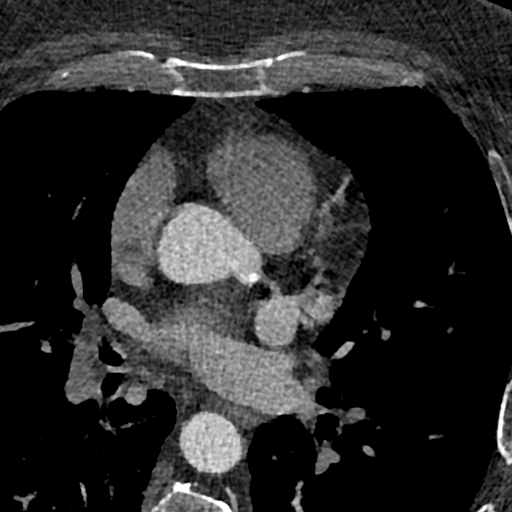
[im 1921/2690  lung]
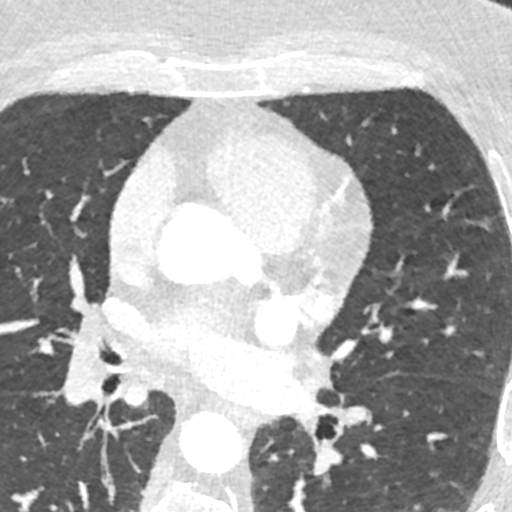
[im 2305/2690  vessel]
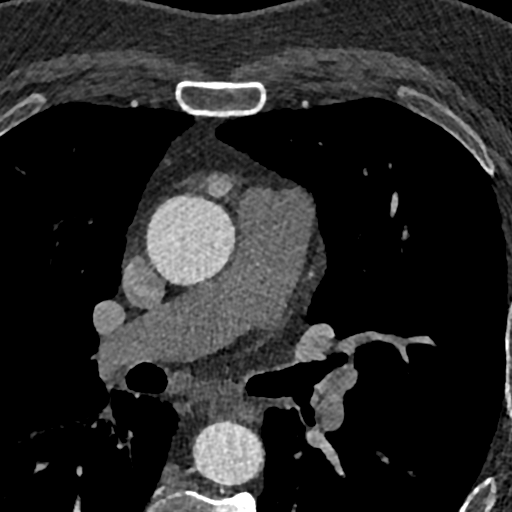

[Series 14: 5-95% · axial · 0.43mm/px · z∈[+1327,+1423]mm · 6 of 2690 slices shown]
[im 385/2690  vessel]
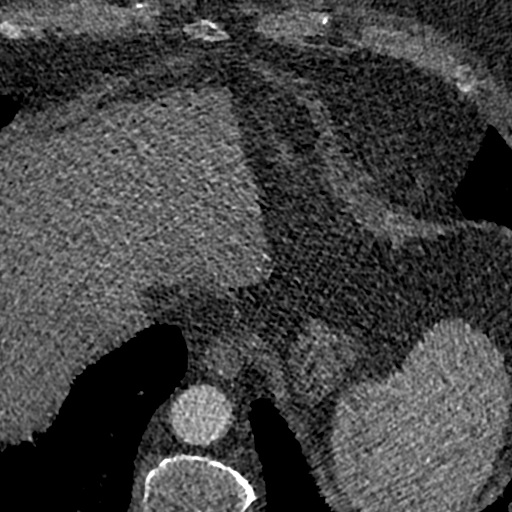
[im 769/2690  vessel]
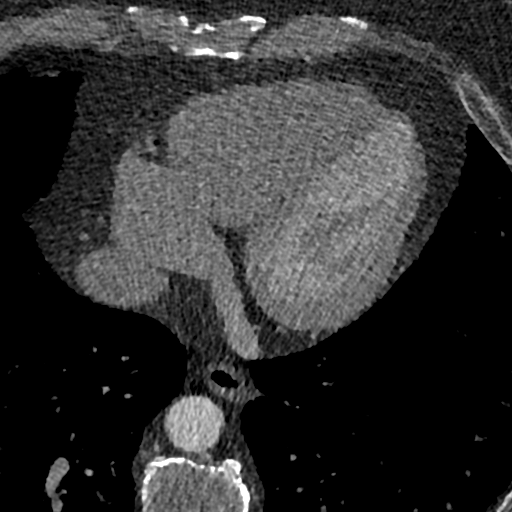
[im 1153/2690  vessel]
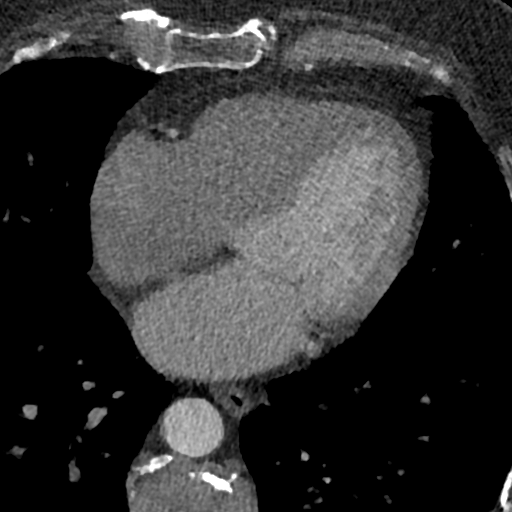
[im 1537/2690  vessel]
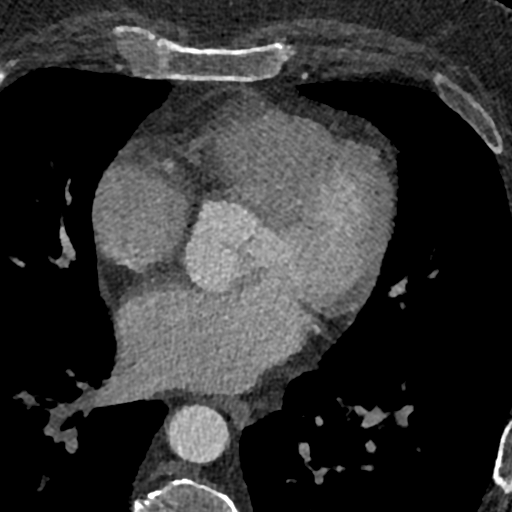
[im 1921/2690  vessel]
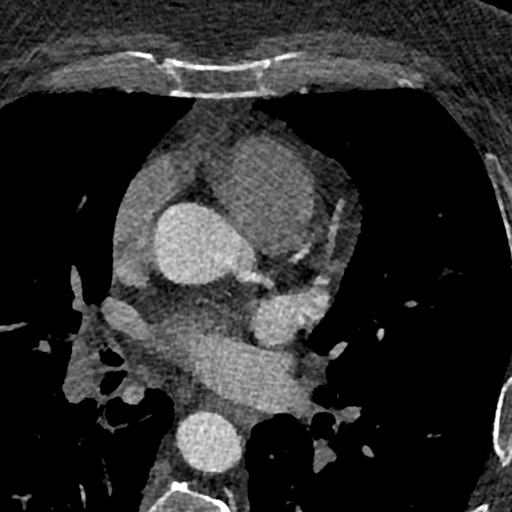
[im 2305/2690  vessel]
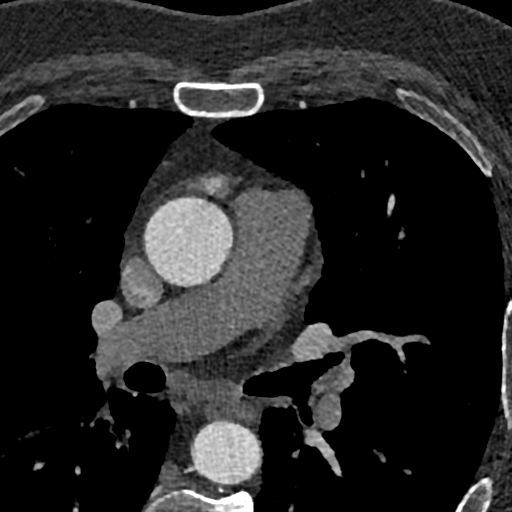

[12 of 20 positions shown; findings below may reference images not displayed]

FINDINGS: Vascular: Tortuous thoracic aorta. Pulmonary arteries not well
opacified secondary to bolus timing.

Mediastinum/Nodes: No imaged thoracic adenopathy.

Lungs/Pleura: No pleural fluid. Right lower lobe 4 mm nodule on
[DATE].

Upper Abdomen: Normal imaged portions of the liver, spleen, stomach.

Musculoskeletal: No acute osseous abnormality.
IMPRESSION: 1.  No acute findings in the imaged extracardiac chest.
2. 4 mm right lower lobe pulmonary nodule. No follow-up needed if
patient is low-risk. Non-contrast chest CT can be considered in 12
months if patient is high-risk. This recommendation follows the
consensus statement: Guidelines for Management of Incidental
Pulmonary Nodules Detected on CT Images: From the [REDACTED]AL DATA:  Atrial fibrillation scheduled for an ablation.

EXAM:
Cardiac CT/CTA
FINDINGS: A 120 kV prospective scan was triggered in the descending thoracic
aorta at 111 HU's. Gantry rotation speed was 280 msecs and
collimation was .9 mm. No beta blockade and no NTG was given. The 3D
data set was reconstructed in 5% intervals of the R-R cycle. Phases
were analyzed on a dedicated work station using MPR, MIP and VRT
modes. The patient received 80 cc of contrast.

Left atrium: Mild enlargement No thrombus in the left atrium or left
atrial appendage.

Left ventricle: Normal in size.

Right atrium: Mild enlargement

Right ventricle:  Mild enlargement

Pericardium: Normal thickness

Pulmonary veins:

Common ostium left pulmonary veins: Measures 3.2cm x 2.6cm in
diameter with area 6.3 cm^2.

Right superior pulmonary vein: Measures 2.3cm x 2.2cm in diameter
with area 3.9 cm^2.

Right inferior pulmonary vein: Measures 2.8cm x 2.5cm in diameter
with area 5.3 cm^2.

Pulmonary arteries: Normal size

Coronary Arteries: Normal coronary origin. Right dominance. The
study was performed without use of NTG and insufficient for plaque
evaluation. Calcium score 74 (55th percentile)

Esophagus: Courses posterior to ostium of left sided pulmonary veins
IMPRESSION: 1. There is normal pulmonary vein drainage into the left atrium.
Common ostium left sided pulmonary veins. Measurements as reported

2.  There is no thrombus in the left atrial appendage.

3. Esophagus courses posterior to ostium of left sided pulmonary
veins

4.  Coronary calcium score 74 (55th percentile)

*** End of Addendum ***
EXAM:
OVER-READ INTERPRETATION  CT CHEST

The following report is an over-read performed by radiologist Dr.
over-read does not include interpretation of cardiac or coronary
anatomy or pathology. The coronary CTA interpretation by the
cardiologist is attached.
FINDINGS: Vascular: Tortuous thoracic aorta. Pulmonary arteries not well
opacified secondary to bolus timing.

Mediastinum/Nodes: No imaged thoracic adenopathy.

Lungs/Pleura: No pleural fluid. Right lower lobe 4 mm nodule on
[DATE].

Upper Abdomen: Normal imaged portions of the liver, spleen, stomach.

Musculoskeletal: No acute osseous abnormality.
IMPRESSION: 1.  No acute findings in the imaged extracardiac chest.
2. 4 mm right lower lobe pulmonary nodule. No follow-up needed if
patient is low-risk. Non-contrast chest CT can be considered in 12
months if patient is high-risk. This recommendation follows the
consensus statement: Guidelines for Management of Incidental
Pulmonary Nodules Detected on CT Images: From the [HOSPITAL]

## 2024-01-11 ENCOUNTER — Ambulatory Visit: Admitting: Cardiology

## 2024-01-13 ENCOUNTER — Other Ambulatory Visit: Payer: Self-pay | Admitting: Cardiology

## 2024-01-25 ENCOUNTER — Ambulatory Visit: Admitting: Physician Assistant

## 2024-02-01 ENCOUNTER — Encounter: Payer: Self-pay | Admitting: Physician Assistant

## 2024-02-01 ENCOUNTER — Ambulatory Visit: Attending: Cardiology | Admitting: Physician Assistant

## 2024-02-01 VITALS — BP 122/78 | HR 72 | Resp 16 | Ht 76.0 in | Wt 283.8 lb

## 2024-02-01 DIAGNOSIS — I447 Left bundle-branch block, unspecified: Secondary | ICD-10-CM | POA: Diagnosis not present

## 2024-02-01 DIAGNOSIS — Z79899 Other long term (current) drug therapy: Secondary | ICD-10-CM

## 2024-02-01 DIAGNOSIS — I10 Essential (primary) hypertension: Secondary | ICD-10-CM | POA: Diagnosis not present

## 2024-02-01 DIAGNOSIS — D6869 Other thrombophilia: Secondary | ICD-10-CM

## 2024-02-01 DIAGNOSIS — Z5181 Encounter for therapeutic drug level monitoring: Secondary | ICD-10-CM

## 2024-02-01 DIAGNOSIS — I4819 Other persistent atrial fibrillation: Secondary | ICD-10-CM

## 2024-02-01 NOTE — Patient Instructions (Signed)
 Medication Instructions:   Your physician recommends that you continue on your current medications as directed. Please refer to the Current Medication list given to you today.   *If you need a refill on your cardiac medications before your next appointment, please call your pharmacy*    Lab Work:   PLEASE GO DOWN STAIRS  [facility]  FIRST FLOOR   ( GET OFF ELEVATORS WALK TOWARDS WAITING AREA LAB LOCATED BY PHARMACY):  BMET AND MAG  AND CBC TODAY        If you have labs (blood work) drawn today and your tests are completely normal, you will receive your results only by: MyChart Message (if you have MyChart) OR A paper copy in the mail If you have any lab test that is abnormal or we need to change your treatment, we will call you to review the results.    Testing/Procedures: NONE ORDERED  TODAY    Follow-Up: At [facility], you and your health needs are our priority.  As part of our continuing mission to provide you with exceptional heart care, our providers are all part of one team.  This team includes your primary Cardiologist (physician) and Advanced Practice Providers or APPs (Physician Assistants and Nurse Practitioners) who all work together to provide you with the care you need, when you need it.  Your next appointment:   4 month(s)   Provider:   Charlies Arthur, PA-C  ( CONTACT  CASSIE HALL/ ANGELINE HAMMER FOR EP SCHEDULING ISSUES )  We recommend signing up for the patient portal called MyChart.  Sign up information is provided on this After Visit Summary.  MyChart is used to connect with patients for Virtual Visits (Telemedicine).  Patients are able to view lab/test results, encounter notes, upcoming appointments, etc.  Non-urgent messages can be sent to your provider as well.   To learn more about what you can do with MyChart, go to [email or web address].   Other Instructions

## 2024-02-01 NOTE — Progress Notes (Signed)
 Cardiology Office Note Date:  02/01/2024  Patient ID:  Jesse Rice, Jesse Rice 1955/10/22, MRN [id number] PCP:  Marvene Prentice SAUNDERS, FNP  Cardiologist:  Dr. Hobart (inactive) Electrophysiologist: Dr. Cindie     Chief Complaint:   Tikosyn  visit  History of Present Illness: Jesse Rice is a 68 y.o. male with history of  HTN, rate related LBBB,  Afib,  dilated Ao root.  He las saw cardiology team by CANDIE Ferrier, PA, 02/26/21, was feeling well post DCCV.  He was back in AFib and unaware, referred to EP for rhythm management options. Planned for CT in 2023 to monitor his Aorta and an echo in a couple years to follow his MR. Pending sleep study  Seeing myself and Dr. Cindie over the years  Saw Dr. Cindie 12/28/22, doing well, low arrhythmia burden, stable QTc, no changes made  I saw him 05/19/23 He is doing very well No CP, palpitations Does not think he has had any AFib now since his ablation/Tiksoyn. 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 Shlomo 07/10/23,  on CPAP feels better, tolerating well, facial hair preventing a good seal Noted 10% compliance in using more than 4 hours nightly.  The patient has been using and benefiting from PAP use and will continue to benefit from therapy.  - encouraged him to try to use his device nightly for at least 5 hours nightly - ordered him new supplies  I saw him 09/01/23 Doing well, no symptoms of AF or otherwise Stable EKG No changes made  TODAY  He continues to do well No AFib since on Tikosyn  No CP, SOB, denies DOE No exertional intolerances No near syncope or syncope No bleeding or sings of bleeding  Clemens off regular use of his CPAP, had some trouble getting supplies, but has them now, urged to use nightly  Afib/AAD hx Diagnosed June 2022 TEE/DCCV 12/23/20 Tikosyn  started Nov 2022 PVI  ablation 05/28/21   Past Medical History:  Diagnosis Date   Atrial fibrillation (HCC)    s/p DCCV in 11/2020   Dilated aortic root (HCC)    Echo 7/22: 41 mm   Hypertension    Hypertensive pulmonary venous disease (HCC) 05/20/2014   IBS (irritable bowel syndrome)    LBBB (left bundle branch block)    rate related   OSA on CPAP    moderate to severe OSA with an AHI of 28.8/hr and nocturnal hypoxemia with O2 sats as low as 81%  On CPAP at 11cm H2O    Past Surgical History:  Procedure Laterality Date   ATRIAL FIBRILLATION ABLATION N/A 05/28/2021   Procedure: ATRIAL FIBRILLATION ABLATION;  Surgeon: Cindie Ole DASEN, MD;  Location: [facility] INVASIVE CV LAB;  Service: Cardiovascular;  Laterality: N/A;   CARDIOVERSION N/A 12/23/2020   Procedure: CARDIOVERSION;  Surgeon: Hobart Powell BRAVO, MD;  Location: [facility] ENDOSCOPY;  Service: Cardiovascular;  Laterality: N/A;   CARDIOVERSION N/A 03/04/2021   Procedure: CARDIOVERSION;  Surgeon: Sheena Pugh, DO;  Location: [facility] ENDOSCOPY;  Service: Cardiovascular;  Laterality: N/A;   CARDIOVERSION N/A 04/28/2021   Procedure: CARDIOVERSION;  Surgeon: Hobart Powell BRAVO, MD;  Location: [facility] ENDOSCOPY;  Service: Cardiovascular;  Laterality: N/A;   CHOLECYSTECTOMY     TEE WITHOUT CARDIOVERSION N/A 12/23/2020   Procedure: TRANSESOPHAGEAL ECHOCARDIOGRAM (TEE);  Surgeon: Hobart Powell BRAVO, MD;  Location[facility] ENDOSCOPY;  Service: Cardiovascular;  Laterality: N/A;    Current Outpatient Medications  Medication Sig Dispense Refill   atorvastatin  (LIPITOR) 10 MG tablet Take 1 tablet (10 mg total) by mouth daily. 90 tablet 3   diphenoxylate -atropine  (LOMOTIL ) 2.5-0.025 MG tablet Take 1 tablet by mouth 4 (four) times daily as needed for diarrhea or loose stools.     dofetilide  (TIKOSYN ) 500 MCG capsule TAKE 1 CAPSULE BY MOUTH 2 TIMES A DAY 180 capsule 3   ELIQUIS  5 MG TABS tablet TAKE 1 TABLET TWICE A DAY 180 tablet 1   lisinopril (ZESTRIL) 2.5 MG tablet Take 2.5 mg by  mouth daily.     metoprolol  succinate (TOPROL -XL) 50 MG 24 hr tablet TAKE 1 TABLET IN THE       MORNING AND AT BEDTIME WITHOR IMMEDIATELY FOLLOWING   MEALS 180 tablet 1   No current facility-administered medications for this visit.    Allergies:   Patient has no known allergies.   Social History:  The patient  reports that he has quit smoking. His smoking use included cigarettes. He has never used smokeless tobacco. He reports that he does not drink alcohol and does not use drugs.   Family History:  The patient's family history includes Arrhythmia in his mother; COPD in his father; Diabetes in his father; Heart murmur in his father; Hypertension in his father and mother; Rheumatic fever in his father.  ROS:  Please see the history of present illness.    All other systems are reviewed and otherwise negative.   PHYSICAL EXAM:  VS:  There were no vitals taken for this visit. BMI: There is no height or weight on file to calculate BMI. Well nourished, well developed, in no acute distress HEENT: normocephalic, atraumatic Neck: no JVD, carotid bruits or masses Cardiac: RRR; no significant murmurs, no rubs, or gallops Lungs: CTA b/l, no wheezing, rhonchi or rales Abd: soft, nontender MS: no deformity or atrophy Ext: no edema Skin: warm and dry, no rash Neuro:  No gross deficits appreciated Psych: euthymic mood, full affect   EKG:  Done today and reviewed by myself shows  SR 80bpm, QTc (not accounting for QRS )  09/07/23: SR, LBBB, LAD, QTc  stable accounting for LBBB  05/19/23: SR 65bpm, LBBB (old), QTc , (not accounting for QRS )  06/16/22: TTE 1. Left ventricular ejection fraction, by estimation, is 65 to 70%. Left  ventricular ejection fraction by PLAX is 66 %. The left ventricle has  normal function. The left ventricle has no regional wall motion  abnormalities. There is mild left ventricular  hypertrophy. Left ventricular diastolic parameters were normal.    2. Right ventricular systolic function is normal. The right ventricular  size is normal. Tricuspid regurgitation signal is inadequate for assessing  PA pressure.   3. Left atrial size was moderately dilated.   4. Right atrial size was moderately dilated.   5. The mitral valve is abnormal. Mild mitral valve regurgitation.   6. The aortic valve is tricuspid. Aortic valve regurgitation is not  visualized.   7. Aortic dilatation noted. There is mild dilatation of the ascending  aorta, measuring 41 mm.   8. The inferior vena cava is normal in size with greater than 50%  respiratory variability, suggesting right atrial pressure of 3 mmHg.   Comparison(s): Changes from prior study are noted. 12/17/2020: LVEF 60-65%,  mild to moderate MR.     12/21/21: CT angio chest IMPRESSION: 1. No thoracic aorta abnormality. 2. No acute  intrathoracic abnormality.  05/18/2021: EPS/ablation CONCLUSIONS: 1. Successful PVI 2. Successful ablation/isolation of the posterior wall 3. Intracardiac echo reveals trivial to small pericardial effusion, left-sided common ostium, normal left ventricular function 4. No early apparent complications. 5.  Colchicine  0.6 mg by mouth twice daily for 5 days 6.  Protonix  40 mg by mouth once daily for 45 days  05/20/21: Cardiac CT IMPRESSION: 1. There is normal pulmonary vein drainage into the left atrium. Common ostium left sided pulmonary veins. Measurements as reported 2.  There is no thrombus in the left atrial appendage. 3. Esophagus courses posterior to ostium of left sided pulmonary veins 4.  Coronary calcium  score 74 (55th percentile)    12/23/2020: TEE  1. Left ventricular ejection fraction, by estimation, is 60 to 65%. The  left ventricle has normal function.   2. Right ventricular systolic function is normal. The right ventricular  size is normal.   3. No left atrial/left atrial appendage thrombus was detected.   4. The mitral valve is normal in  structure. There is mild-to-moderate vs  moderate mitral regurgitation with multiple jets seen along the coaptation  line. Degree of MR varied with blood pressure during the study. No  evidence of mitral stenosis.   5. The aortic valve is tricuspid. There is mild thickening of the aortic  valve. Aortic valve regurgitation is not visualized. Mild aortic valve  sclerosis is present, with no evidence of aortic valve stenosis.   6. There is mild (Grade II) plaque.    TEE 12/23/20 EF 60-65, normal RVSF, no LAA clot, mild to mod MR, AV sclerosis   Echocardiogram 12/17/20 EF 60-65, no RWMA, normal RVSF, mild to mod MR, Ao root 41 mm  Recent Labs: 05/19/2023: Hemoglobin 13.4; Platelets 253 09/07/2023: BUN 10; Creatinine, Ser 1.37; Magnesium  2.1; Potassium 4.8; Sodium 142  No results found for requested labs within last 365 days.   CrCl cannot be calculated (Patient's most recent lab result is older than the maximum 21 days allowed.).   Wt Readings from Last 3 Encounters:  09/07/23 278 lb 0.3 oz (126.1 kg)  07/10/23 287 lb 3.2 oz (130.3 kg)  05/19/23 287 lb 12.8 oz (130.5 kg)     Other studies reviewed: Additional studies/records reviewed today include: summarized above  ASSESSMENT AND PLAN:  Persistent Afib CHA2DS2Vasc is 3, on Eliquis , appropriately dosed Tikosyn  with stable QTc Low/no burden by symptoms Labs today meds reviewed (lomotil  is ok)    HTN Looks good   3. LBBB Historically rate related by chart notes Preserved LVEF No symptoms of HF or CAD Minimal Ca on his CT scan last year Update echo next visit  4. Secondary hypercoagulable state  5. AO root by echo last year 41mm CT scan 2023 aorta was normal Update echo next visit   Disposition: back in 1mo, sooner if needed   Current medicines are reviewed at length with the patient today.  The patient did not have any concerns regarding medicines.  Bonney Charlies Arthur, PA-C 02/01/2024 12:25 PM     [facility] [address] [place] [address] [phone] (office)  [phone] (fax)

## 2024-02-02 LAB — BASIC METABOLIC PANEL WITH GFR
BUN/Creatinine Ratio: 9 — ABNORMAL LOW (ref 10–24)
BUN: 13 mg/dL (ref 8–27)
CO2: 25 mmol/L (ref 20–29)
Calcium: 9.4 mg/dL (ref 8.6–10.2)
Chloride: 103 mmol/L (ref 96–106)
Creatinine, Ser: 1.42 mg/dL — ABNORMAL HIGH (ref 0.76–1.27)
Glucose: 106 mg/dL — ABNORMAL HIGH (ref 70–99)
Potassium: 4.4 mmol/L (ref 3.5–5.2)
Sodium: 142 mmol/L (ref 134–144)
eGFR: 55 mL/min/1.73 — ABNORMAL LOW (ref >59)

## 2024-02-02 LAB — CBC
Hematocrit: 40.5 % (ref 37.5–51.0)
Hemoglobin: 13.1 g/dL (ref 13.0–17.7)
MCH: 30.6 pg (ref 26.6–33.0)
MCHC: 32.3 g/dL (ref 31.5–35.7)
MCV: 95 fL (ref 79–97)
Platelets: 248 x10E3/uL (ref 150–450)
RBC: 4.28 x10E6/uL (ref 4.14–5.80)
RDW: 12.1 % (ref 11.6–15.4)
WBC: 9.8 x10E3/uL (ref 3.4–10.8)

## 2024-02-02 LAB — MAGNESIUM: Magnesium: 2 mg/dL (ref 1.6–2.3)

## 2024-02-05 ENCOUNTER — Ambulatory Visit: Payer: Self-pay | Admitting: Physician Assistant

## 2024-02-05 DIAGNOSIS — Z79899 Other long term (current) drug therapy: Secondary | ICD-10-CM

## 2024-02-23 ENCOUNTER — Other Ambulatory Visit: Payer: Self-pay | Admitting: Cardiology

## 2024-02-23 DIAGNOSIS — E782 Mixed hyperlipidemia: Secondary | ICD-10-CM | POA: Diagnosis not present

## 2024-02-23 DIAGNOSIS — I10 Essential (primary) hypertension: Secondary | ICD-10-CM

## 2024-02-23 DIAGNOSIS — K580 Irritable bowel syndrome with diarrhea: Secondary | ICD-10-CM | POA: Diagnosis not present

## 2024-02-23 DIAGNOSIS — E669 Obesity, unspecified: Secondary | ICD-10-CM | POA: Diagnosis not present

## 2024-02-23 DIAGNOSIS — G4733 Obstructive sleep apnea (adult) (pediatric): Secondary | ICD-10-CM

## 2024-02-23 DIAGNOSIS — Z0000 Encounter for general adult medical examination without abnormal findings: Secondary | ICD-10-CM | POA: Diagnosis not present

## 2024-02-23 DIAGNOSIS — Z6838 Body mass index (BMI) 38.0-38.9, adult: Secondary | ICD-10-CM | POA: Diagnosis not present

## 2024-02-23 DIAGNOSIS — N529 Male erectile dysfunction, unspecified: Secondary | ICD-10-CM | POA: Diagnosis not present

## 2024-02-23 DIAGNOSIS — N183 Chronic kidney disease, stage 3 unspecified: Secondary | ICD-10-CM | POA: Diagnosis not present

## 2024-02-23 DIAGNOSIS — D649 Anemia, unspecified: Secondary | ICD-10-CM | POA: Diagnosis not present

## 2024-02-23 DIAGNOSIS — I4891 Unspecified atrial fibrillation: Secondary | ICD-10-CM

## 2024-02-23 NOTE — Telephone Encounter (Signed)
 Eliquis  5mg  refill request received. Patient is 68 years old, weight-128.7kg, Crea-1.42 on 02/01/24, Diagnosis-Afib, and last seen by Charlies Arthur on 02/01/24. Dose is appropriate based on dosing criteria. Will send in refill to requested pharmacy.

## 2024-03-27 ENCOUNTER — Other Ambulatory Visit (HOSPITAL_COMMUNITY): Payer: Self-pay

## 2024-03-27 ENCOUNTER — Telehealth: Payer: Self-pay | Admitting: Pharmacy Technician

## 2024-03-27 ENCOUNTER — Telehealth: Payer: Self-pay | Admitting: Cardiology

## 2024-03-27 NOTE — Telephone Encounter (Signed)
 This information  has been sent and in process with  prior authorization  team.   Jesse Rice, CPhT    03/27/24 10:15 AM Note Pharmacy Patient Advocate Encounter   Received notification from CoverMyMeds that prior authorization for Dofetilide  500 MCG is required/requested.   Insurance verification completed.   The patient is insured through [employer]   Per test claim: PA required; PA submitted to above mentioned insurance via Latent Key/confirmation #/EOC [facility] Status is pending

## 2024-03-27 NOTE — Telephone Encounter (Addendum)
 Pharmacy Patient Advocate Encounter   Received notification from CoverMyMeds that prior authorization for Dofetilide  500 MCG is required/requested.   Insurance verification completed.   The patient is insured through [employer]   Per test claim: PA required; PA submitted to above mentioned insurance via Latent Key/confirmation #/EOC [facility] Status is pending

## 2024-03-27 NOTE — Telephone Encounter (Signed)
   Pt c/o medication issue:  1. Name of Medication:   dofetilide  (TIKOSYN ) 500 MCG capsule    2. How are you currently taking this medication (dosage and times per day)? TAKE 1 CAPSULE BY MOUTH 2 TIMES A DAY   3. Are you having a reaction (difficulty breathing--STAT)? No   4. What is your medication issue? [facility] calling about prior authorization for   dofetilide  (TIKOSYN ) 500 MCG capsule    Rx will now being going to  [facility] [address], [place], [place] - [address]  . Office should expect a fax from [facility]. Rx will need to go to [phone]  Callback#: [phone]

## 2024-03-27 NOTE — Telephone Encounter (Deleted)
 Pharmacy Patient Advocate Encounter  Received notification from [employer] that Prior Authorization for Sacubitril- valsartan 24-26 MG has been APPROVED from 03/27/24 to 05/29/24. Ran test claim, Copay is $214.45- one month. This test claim was processed through [facility]- copay amounts may vary at other pharmacies due to pharmacy/plan contracts, or as the patient moves through the different stages of their insurance plan.   PA #/Case ID/Reference #: [id number]

## 2024-07-03 ENCOUNTER — Telehealth: Payer: Self-pay | Admitting: Cardiology

## 2024-07-03 MED ORDER — METOPROLOL SUCCINATE ER 50 MG PO TB24: 50.0000 mg | ORAL_TABLET | Freq: Two times a day (BID) | ORAL | 0 refills | Status: AC

## 2024-07-03 NOTE — Telephone Encounter (Signed)
 Refill sent for 30 days, pt needs an appointment.

## 2024-07-03 NOTE — Telephone Encounter (Signed)
" °*  STAT* If patient is at the pharmacy, call can be transferred to refill team.   1. Which medications need to be refilled? (please list name of each medication and dose if known) metoprolol  succinate (TOPROL -XL) 50 MG 24 hr tablet    2. Would you like to learn more about the convenience, safety, & potential cost savings by using the [facility]? No    3. Are you open to using the [facility] (Type [facility].  ). No    4. Which pharmacy/location (including street and city if local pharmacy) is medication to be sent to?[facility] - [place], [place] - [facility] AT Portal to Registered [facility] Sites    5. Do they need a 30 day or 90 day supply? 90  "
